# Patient Record
Sex: Female | Born: 1990 | Race: White | Hispanic: No | Marital: Single | Smoking: Never smoker
Health system: Southern US, Community
[De-identification: ages and names within clinical notes are randomized; demographics above are authoritative.]

## PROBLEM LIST (undated history)

## (undated) DIAGNOSIS — B3731 Acute candidiasis of vulva and vagina: Secondary | ICD-10-CM

## (undated) DIAGNOSIS — B373 Candidiasis of vulva and vagina: Secondary | ICD-10-CM

## (undated) DIAGNOSIS — N390 Urinary tract infection, site not specified: Secondary | ICD-10-CM

## (undated) HISTORY — DX: Acute candidiasis of vulva and vagina: B37.31

## (undated) HISTORY — DX: Urinary tract infection, site not specified: N39.0

## (undated) HISTORY — PX: FRACTURE SURGERY: SHX138

## (undated) HISTORY — DX: Candidiasis of vulva and vagina: B37.3

## (undated) HISTORY — PX: OTHER SURGICAL HISTORY: SHX169

---

## 2009-11-13 ENCOUNTER — Encounter: Payer: Self-pay | Admitting: Endocrinology

## 2009-11-13 LAB — CONVERTED CEMR LAB
ALT: 11 units/L
AST: 16 units/L
BUN: 9 mg/dL
Calcium: 9 mg/dL
Eosinophils Relative: 0.1 %
Glucose, Bld: 76 mg/dL
Hemoglobin: 13.3 g/dL
Monocytes Relative: 0.4 %
Total Bilirubin: 0.3 mg/dL
Total Protein: 6.6 g/dL
WBC: 5.6 10*3/uL

## 2009-11-16 ENCOUNTER — Encounter: Payer: Self-pay | Admitting: Endocrinology

## 2009-11-23 ENCOUNTER — Encounter: Payer: Self-pay | Admitting: Endocrinology

## 2009-11-28 ENCOUNTER — Encounter: Payer: Self-pay | Admitting: Endocrinology

## 2009-12-07 ENCOUNTER — Ambulatory Visit: Payer: Self-pay | Admitting: Endocrinology

## 2009-12-07 DIAGNOSIS — E041 Nontoxic single thyroid nodule: Secondary | ICD-10-CM

## 2010-04-30 NOTE — Op Note (Signed)
Summary: US guided Thyroid BX/[facility]  US guided Thyroid BX/[facility]   Imported By: Lester [place] 12/12/2009 08:07:27  _____________________________________________________________________  External Attachment:    Type:   Image     Comment:   External Document

## 2010-04-30 NOTE — Assessment & Plan Note (Signed)
Summary: NEW ENDO CONSULT/ THROID MASS/ [place] ACCESS/[facility]  #   Vital Signs:  Patient profile:   20 year old female Height:      62.5 inches (158.75 cm) Weight:      126.13 pounds (57.33 kg) BMI:     22.78 O2 Sat:      98 % on Room air Temp:     98.2 degrees F (36.78 degrees C) oral Pulse rate:   92 / minute BP sitting:   102 / 64  (left arm) Cuff size:   regular  Vitals Entered By: Brenton Grills MA (December 07, 2009 1:20 PM)  O2 Flow:  Room air CC: New Endo/Thyroid mass/aj   Referring Provider:  Jerlyn Ly MD Primary Provider:  Jerlyn Ly MD  CC:  New Endo/Thyroid mass/aj.  History of Present Illness: pt was noted on routine exam to have right lobe thyroid nodule, in 2010, and again in 2011.  pt says she does not notice the nodule.  no h/o xrt.  no neck pain. she does not have anxiety in general.  Current Medications (verified): 1)  Tri-Sprintec 0.18/0.215/0.25 Mg-35 Mcg Tabs (Norgestim-Eth Estrad Triphasic) .Marland Kitchen.. 1 Tablet By Mouth Once Daily 2)  Omega-3 350 Mg Caps (Omega-3 Fatty Acids) .Marland Kitchen.. 1 By Mouth Once Daily  Allergies (verified): No Known Drug Allergies  Past History:  Past Medical History: THYROID NODULE, RIGHT (ICD-241.0) FAMILY HISTORY OF ALCOHOLISM/ADDICTION (ICD-V61.41)  Family History: Reviewed history and no changes required. Family History of Alcoholism/Addiction (Grandparents) Family History of Arthritis (Grandparents) Family History of Prostate CA 1st degree relative <50  no thyroid dz  Social History: Reviewed history and no changes required. Single Never Smoked Alcohol use-no Drug use-no Regular exercise-yes full-time student Smoking Status:  never Drug Use:  no Does Patient Exercise:  yes Seat Belt Use:  yes  Review of Systems       denies difficulty swallowing or breathing.   denies weight loss, headache, hoarseness, double vision, palpitations, diarrhea, polyuria, myalgias, excessive diaphoresis, numbness, tremor,  bruising, and rhinorrhea.  she has monthly bleeding on the oral contraceptives  Physical Exam  General:  normal appearance.   Head:  head: no deformity eyes: no periorbital swelling, no proptosis external nose and ears are normal Neck:  2 cm right lobe thyroid nodule.  i cannot determine that the nodule is freely mobile.   Lungs:  Clear to auscultation bilaterally. Normal respiratory effort.  Heart:  Regular rate and rhythm without murmurs or gallops noted. Normal S1,S2.   Msk:  muscle bulk and strength are grossly normal.  no obvious joint swelling.  gait is normal and steady  Extremities:  no deformity no edema Skin:  not diaphoretic  Cervical Nodes:  No significant adenopathy.  Psych:  Alert and cooperative; normal mood and affect; normal attention span and concentration.   Additional Exam:  outside test results are reviewed:  tsh=normal ultrasound:  corresponds to palpable right thyroid nodule cytology:  suspicious for malignancy   Impression & Recommendations:  Problem # 1:  THYROID NODULE, RIGHT (ICD-241.0) bx is suspicious for malignancy 15 minute discussion, of high risk of malignancy, in 30 min appt  Problem # 2:  anxiety pt says related to dr visits.  Medications Added to Medication List This Visit: 1)  Tri-sprintec 0.18/0.215/0.25 Mg-35 Mcg Tabs (Norgestim-eth estrad triphasic) .Marland Kitchen.. 1 tablet by mouth once daily 2)  Omega-3 350 Mg Caps (Omega-3 fatty acids) .Marland Kitchen.. 1 by mouth once daily  Other Orders: Surgical Referral (Surgery)  New Patient Level IV (91478)  Patient Instructions: 1)  based on the result of the biopsy, you should see a surgery specialist, to consider removal of the suspicious lump.  you will be called with a day and time for an appointment.    Appended Document: NEW ENDO CONSULT/ THROID MASS/ [place] ACCESS/[facility]  # please cc to pcp dr Fredia Beets, thanks  Appended Document: NEW ENDO CONSULT/ THROID MASS/ [place] ACCESS/N[facility]  # faxed to [facility]/Dawn Binnie Kand at [phone]

## 2010-04-30 NOTE — Letter (Signed)
Summary: Publishing copy Family Medicine  [facility]   Imported By: Lester [place] 12/12/2009 07:59:58  _____________________________________________________________________  External Attachment:    Type:   Image     Comment:   External Document

## 2011-07-30 ENCOUNTER — Encounter: Payer: Self-pay | Admitting: Obstetrics and Gynecology

## 2011-07-30 ENCOUNTER — Ambulatory Visit (INDEPENDENT_AMBULATORY_CARE_PROVIDER_SITE_OTHER): Payer: BC Managed Care – PPO | Admitting: Obstetrics and Gynecology

## 2011-07-30 VITALS — BP 98/60 | Resp 16 | Ht 62.0 in | Wt 130.0 lb

## 2011-07-30 DIAGNOSIS — N898 Other specified noninflammatory disorders of vagina: Secondary | ICD-10-CM

## 2011-07-30 DIAGNOSIS — IMO0002 Reserved for concepts with insufficient information to code with codable children: Secondary | ICD-10-CM | POA: Insufficient documentation

## 2011-07-30 DIAGNOSIS — N760 Acute vaginitis: Secondary | ICD-10-CM

## 2011-07-30 DIAGNOSIS — N762 Acute vulvitis: Secondary | ICD-10-CM | POA: Insufficient documentation

## 2011-07-30 LAB — POCT WET PREP (WET MOUNT): pH: 4.5

## 2011-07-30 LAB — POCT URINALYSIS DIPSTICK
Bilirubin, UA: NEGATIVE
Blood, UA: NEGATIVE
Glucose, UA: NEGATIVE
Ketones, UA: NEGATIVE
Leukocytes, UA: NEGATIVE
pH, UA: 5

## 2011-07-30 MED ORDER — CLOBETASOL PROPIONATE 0.05 % EX CREA: TOPICAL_CREAM | Freq: Two times a day (BID) | CUTANEOUS | Status: DC

## 2011-07-30 NOTE — Progress Notes (Signed)
Odor: no Fever: no Pelvic Pain: no  Itching: yes Dyspareunia: yes Desires GC/CT: no  Thin: yes History of PID: no Desires HIV,RPR,HbsAG: no  Thick: yes History of STD: no Other: Burning w/ urination, and burning on the outside of her vagina area. Pt declines STD testing she stated she has been w/ her partner for 5 years now.   Subjective:    Pamela Carr is a 21 y.o. female, No obstetric history on file., who presents for a New patient problem exam. See above.  In November of 2012 the patient had a dental procedure and was given 10 days of penicillin.  After that she began having recurrent "yeast infections" that have not responded to Diflucan, and over-the-counter antifungal creams.  She now complains of vaginal and rectal burning and itching.  She complains of dyspareunia.  She has some dysuria.  She currently uses Ortho Tri-Cyclen for contraception.  She denies a past history of sexual transmitted infections.  Drug allergies: None  Past medical history: The patient had her wisdom teeth removed in November 2012.  She broke her arm in 1998.  Her current medications include birth control pills and probiotic.  Review of systems: Please see history of present illness.  Family history: The patient has a family history of asthma and rheumatoid arthritis.    History   Social History  . Marital Status: Single    Spouse Name: N/A    Number of Children: N/A  . Years of Education: N/A   Social History Main Topics  . Smoking status: Never Smoker   . Smokeless tobacco: None  . Alcohol Use: No  . Drug Use: No  . Sexually Active: None   Other Topics Concern  . None   Social History Narrative  . None    Menstrual cycle:   LMP: Patient's last menstrual period was 07/02/2011.           Cycle: Regular, monthly with normal flow and no severe dysmenorrha  The following portions of the patient's history were reviewed and updated as appropriate: allergies, current medications, past family  history, past medical history, past social history, past surgical history and problem list.  Review of Systems Pertinent items are noted in HPI. Breast:Negative for breast lump,nipple discharge or nipple retraction Gastrointestinal: Negative for abdominal pain, change in bowel habits or rectal bleeding Urinary:negative   Objective:    BP 98/60  Resp 16  Ht 5\' 2"  (1.575 m)  Wt 130 lb (58.968 kg)  BMI 23.78 kg/m2  LMP 07/02/2011    Weight:  Wt Readings from Last 1 Encounters:  07/30/11 130 lb (58.968 kg)          BMI: Body mass index is 23.78 kg/(m^2).  General Appearance: Alert, appropriate appearance for age. No acute distress HEENT: Grossly normal Neck / Thyroid: Supple, no masses, nodes or enlargement Lungs: clear to auscultation bilaterally Back: No CVA tenderness Breast Exam: deferred Cardiovascular: Regular rate and rhythm. S1, S2, no murmur Gastrointestinal: Soft, non-tender, no masses or organomegaly  ++++++++++++++++++++++++++++++++++++++++++++++++++++++++  Pelvic Exam: External genitalia: slightly red, tenderness at the introitus Vaginal: normal without tenderness, induration or masses Cervix: normal appearance Adnexa: normal bimanual exam Uterus: mid-position and nontender, normal size Rectovaginal: not indicated, no lesions noted at the anus  ++++++++++++++++++++++++++++++++++++++++++++++++++++++++  Lymphatic Exam: Non-palpable nodes in neck, clavicular, axillary, or inguinal regions Neurologic: Normal speech, no tremor  Psychiatric: Alert and oriented, appropriate affect.   Wet Prep:no pathogens, pH 4.5 and Whiff negative Urinalysis: negative UPT: Not done  Assessment:    vulvar irritation /vulvar and vaginal burning  dyspareunia  Overweight or obese: No   Pelvic relaxation: No   Plan:    We will try Temovate cream to the vulva for one month.  The patient will also apply olive oil to the area.  She will refrain from intercourse for 2 weeks to  give herself a chance to heal.  She will return to the office in 4 weeks for repeat examination. Contraception:oral contraceptives (estrogen/progesterone)    STD screen request: GC, chlamydia  RPR: No.  HBsAg: No. Hepatitis C: No.  Leonard Schwartz M.D.

## 2011-07-31 ENCOUNTER — Telehealth: Payer: Self-pay | Admitting: Obstetrics and Gynecology

## 2011-07-31 LAB — GC/CHLAMYDIA PROBE AMP, GENITAL: Chlamydia, DNA Probe: NEGATIVE

## 2011-07-31 NOTE — Telephone Encounter (Signed)
Spoke with pt rgd msg pt wants clarity on direction for vaginal cream prescribed by AVS pt states direction on box states use twice a day but AVs states every other day first week and twice a week after until f/u appt advised pt to use cream as directed by AVS pt voice understanding

## 2011-08-18 ENCOUNTER — Telehealth: Payer: Self-pay | Admitting: Obstetrics and Gynecology

## 2011-08-18 NOTE — Telephone Encounter (Signed)
Triage/epic/general ques

## 2011-08-18 NOTE — Telephone Encounter (Signed)
TC to pt. LM to return call regarding message. 

## 2011-08-18 NOTE — Telephone Encounter (Signed)
TC from pt.  States cream pescribed by Dr AVS has helped vag irritation.  States has noticed mold in bathtib area and is questioning if could contribute to cause.  Informed not able to confirm but can discuss w/Dr AVS at [place].  Pt agreeable.

## 2011-08-19 ENCOUNTER — Encounter: Payer: Self-pay | Admitting: Obstetrics and Gynecology

## 2011-08-19 ENCOUNTER — Ambulatory Visit (INDEPENDENT_AMBULATORY_CARE_PROVIDER_SITE_OTHER): Payer: BC Managed Care – PPO | Admitting: Obstetrics and Gynecology

## 2011-08-19 VITALS — BP 90/60 | Ht 62.0 in | Wt 128.0 lb

## 2011-08-19 DIAGNOSIS — N390 Urinary tract infection, site not specified: Secondary | ICD-10-CM

## 2011-08-19 DIAGNOSIS — N899 Noninflammatory disorder of vagina, unspecified: Secondary | ICD-10-CM

## 2011-08-19 DIAGNOSIS — N94819 Vulvodynia, unspecified: Secondary | ICD-10-CM

## 2011-08-19 DIAGNOSIS — N898 Other specified noninflammatory disorders of vagina: Secondary | ICD-10-CM

## 2011-08-19 LAB — POCT URINALYSIS DIPSTICK
Bilirubin, UA: NEGATIVE
Blood, UA: NEGATIVE
Glucose, UA: NEGATIVE
Ketones, UA: NEGATIVE
Nitrite, UA: NEGATIVE
pH, UA: 7

## 2011-08-19 NOTE — Progress Notes (Signed)
Ms. Nichol Ator is a 21 y.o. year old female,No obstetric history on file., who presents for a problem visit. She was treated with Temovate for vulvar discomfort and dyspareunia. She is slightly improved.   Subjective:  Wants to rule out STD. C/O urethral burning.  Objective:  BP 90/60  Ht 5\' 2"  (1.575 m)  Wt 128 lb (58.06 kg)  BMI 23.41 kg/m2  LMP 08/06/2011   Extremities: varicose veins noted  External genitalia: normal general appearance Vaginal: normal mucosa without prolapse or lesions Cervix: normal appearance Adnexa: normal bimanual exam Uterus: normal single, nontender  Assessment:  Dyspareunia Vulvar discomfort  Plan:  STD screen.  Estrogen cream to urethra.  Return to office prn if symptoms worsen or fail to improve.    Leonard Schwartz M.D.  08/19/2011 4:35 PM

## 2011-08-20 LAB — HIV ANTIBODY (ROUTINE TESTING W REFLEX): HIV: NONREACTIVE

## 2011-08-20 LAB — HEPATITIS C ANTIBODY: HCV Ab: NEGATIVE

## 2011-08-20 LAB — HSV 1 ANTIBODY, IGG: HSV 1 Glycoprotein G Ab, IgG: <0.1 IV

## 2011-08-20 LAB — GC/CHLAMYDIA PROBE AMP, GENITAL: GC Probe Amp, Genital: NEGATIVE

## 2011-08-20 LAB — RPR

## 2011-08-27 ENCOUNTER — Encounter: Payer: Self-pay | Admitting: Obstetrics and Gynecology

## 2011-08-27 ENCOUNTER — Ambulatory Visit (INDEPENDENT_AMBULATORY_CARE_PROVIDER_SITE_OTHER): Payer: BC Managed Care – PPO | Admitting: Obstetrics and Gynecology

## 2011-08-27 VITALS — BP 112/60 | Ht 62.5 in | Wt 129.0 lb

## 2011-08-27 DIAGNOSIS — N390 Urinary tract infection, site not specified: Secondary | ICD-10-CM

## 2011-08-27 DIAGNOSIS — F419 Anxiety disorder, unspecified: Secondary | ICD-10-CM | POA: Insufficient documentation

## 2011-08-27 DIAGNOSIS — F411 Generalized anxiety disorder: Secondary | ICD-10-CM

## 2011-08-27 DIAGNOSIS — IMO0002 Reserved for concepts with insufficient information to code with codable children: Secondary | ICD-10-CM

## 2011-08-27 LAB — POCT URINALYSIS DIPSTICK
Bilirubin, UA: NEGATIVE
Blood, UA: NEGATIVE
Glucose, UA: NEGATIVE
Nitrite, UA: NEGATIVE
Spec Grav, UA: 1.015
Urobilinogen, UA: NEGATIVE

## 2011-08-27 NOTE — Progress Notes (Signed)
[name] is a 21 y.o. year old female,No obstetric history on file., who presents for a problem visit. The patient has a four-month history of dyspareunia.  She has complaints of dysuria.  Her dysuria has improved with estrogen cream to the urethra.  Her urine studies are negative.  GC, Chlamydia, HIV, RPR, hepatitis C, HBsAg, herpes test are all negative.  Subjective:  Although she is better than with her last visit she still complains of dyspareunia particularly at the 7 o'clock position of the introitus.  Temovate cream helped temporarily but is not helpful now.  This is very frustrating for her.  The patient admits that she is anxious.  Objective:  BP 112/60  Ht 5' 2.5" (1.588 m)  Wt 129 lb (58.514 kg)  BMI 23.22 kg/m2  LMP 08/06/2011   General: anxious  External genitalia: normal general appearance and tenderness at the 7 o'clock position of the introitus.  No lesions seen. Urinary system: urethral meatus normal Vaginal: normal mucosa without prolapse or lesions Cervix: normal appearance Adnexa: normal bimanual exam Uterus: normal size and nontender. no obvious etiology for the patient's discomfort  Assessment:  Dyspareunia Improved dysuria Anxious  Plan:  The patient was encouraged to continue estrogen cream to the introitus. Discontinue Temovate. The patient wants to try trigger point injections of Marcaine and a steroid to see if this can help her discomfort improved.  Return to office in 2 week(s).   Leonard Schwartz M.D.  08/27/2011 7:28 PM

## 2011-09-11 ENCOUNTER — Ambulatory Visit (INDEPENDENT_AMBULATORY_CARE_PROVIDER_SITE_OTHER): Payer: BC Managed Care – PPO | Admitting: Obstetrics and Gynecology

## 2011-09-11 ENCOUNTER — Encounter: Payer: Self-pay | Admitting: Obstetrics and Gynecology

## 2011-09-11 VITALS — BP 120/64 | Ht 62.5 in | Wt 130.0 lb

## 2011-09-11 DIAGNOSIS — Z01419 Encounter for gynecological examination (general) (routine) without abnormal findings: Secondary | ICD-10-CM

## 2011-09-11 DIAGNOSIS — Z309 Encounter for contraceptive management, unspecified: Secondary | ICD-10-CM

## 2011-09-11 DIAGNOSIS — E041 Nontoxic single thyroid nodule: Secondary | ICD-10-CM

## 2011-09-11 DIAGNOSIS — Z124 Encounter for screening for malignant neoplasm of cervix: Secondary | ICD-10-CM

## 2011-09-11 LAB — CBC
HCT: 38.9 % (ref 36.0–46.0)
Hemoglobin: 12.5 g/dL (ref 12.0–15.0)
MCHC: 32.1 g/dL (ref 30.0–36.0)
RBC: 4.54 MIL/uL (ref 3.87–5.11)
WBC: 6.6 10*3/uL (ref 4.0–10.5)

## 2011-09-11 MED ORDER — NORGESTIMATE-ETH ESTRADIOL 0.25-35 MG-MCG PO TABS: 1.0000 | ORAL_TABLET | Freq: Every day | ORAL | Status: DC

## 2011-09-11 NOTE — Progress Notes (Signed)
Last Pap: 08/2010 WNL: Yes Regular Periods:yes Contraception: BC Pills  Monthly Breast exam:no Tetanus<34yrs:yes Nl.Bladder Function:yes Daily BMs:yes Healthy Diet:yes Calcium:no Mammogram:no Exercise:yes Seatbelt: yes Abuse at home: no Stressful work:no Sigmoid-colonoscopy: N/A Bone Density: No  Pt here to f/u for vaginal irritation from yeast infection starting after taking abx. States that she is not having the irritation as much. Only occasionally after intercourse. Requested to have AEX done today while she was here.   ANNUAL GYNECOLOGIC EXAMINATION   Pamela Carr is a 21 y.o. female, G0P0000, who presents for an annual exam. See above. Her dyspareunia has improved dramatically and her bulbar discomfort has also improved dramatically.  She reports that she has a history of a thyroid nodule that has been evaluated and found to be benign.  She tends to be anxious.  Prior Hysterectomy: No    History   Social History  . Marital Status: Single    Spouse Name: N/A    Number of Children: N/A  . Years of Education: N/A   Social History Main Topics  . Smoking status: Never Smoker   . Smokeless tobacco: Never Used  . Alcohol Use: No  . Drug Use: No  . Sexually Active: Yes    Birth Control/ Protection: Pill   Other Topics Concern  . None   Social History Narrative  . None    Menstrual cycle:   LMP: Patient's last menstrual period was 09/03/2011.           Cycle: Regular, monthly with normal flow and no severe dysmenorrha  The following portions of the patient's history were reviewed and updated as appropriate: allergies, current medications, past family history, past medical history, past social history, past surgical history and problem list.  Review of Systems Pertinent items are noted in HPI. Breast:Negative for breast lump,nipple discharge or nipple retraction Gastrointestinal: Negative for abdominal pain, change in bowel habits or rectal  bleeding Urinary:negative   Objective:    BP 120/64  Ht 5' 2.5" (1.588 m)  Wt 130 lb (58.968 kg)  BMI 23.40 kg/m2  LMP 09/03/2011    Weight:  Wt Readings from Last 1 Encounters:  09/11/11 130 lb (58.968 kg)          BMI: Body mass index is 23.40 kg/(m^2).  General Appearance: Alert, appropriate appearance for age. No acute distress HEENT: Grossly normal Neck / Thyroid: Supple, nontender, fullness noted on the right lower lobe Lungs: clear to auscultation bilaterally Back: No CVA tenderness Breast Exam: No masses or nodes.No dimpling, nipple retraction or discharge. Cardiovascular: Regular rate and rhythm. S1, S2, no murmur Gastrointestinal: Soft, non-tender, no masses or organomegaly  ++++++++++++++++++++++++++++++++++++++++++++++++++++++++  Pelvic Exam: External genitalia: normal general appearance Vaginal: normal without tenderness, induration or masses and relaxation: No Cervix: normal appearance Adnexa: normal bimanual exam Uterus: normal size, shape, and consistency Rectovaginal: not indicated  ++++++++++++++++++++++++++++++++++++++++++++++++++++++++  Lymphatic Exam: Non-palpable nodes in neck, clavicular, axillary, or inguinal regions Neurologic: Normal speech, no tremor  Psychiatric: Alert and oriented, appropriate affect.   Wet Prep:   not applicable Urinalysis:  not applicable UPT:           Not done   Assessment:    Normal gyn exam   Overweight or obese: No   Pelvic relaxation: No  Improved dyspareunia and vulvar discomfort.  Contraceptive management  Benign thyroid cyst   Plan:    pap smear return annually or prn Contraception:Ortho-Cyclen    Medications prescribed: Ortho-Cyclen  STD screen request: none  RPR: No.  HBsAg: No.  Hepatitis C: No.  The updated Pap smear screening guidelines were discussed with the patient. The patient requested that I obtain a Pap smear: Yes.  Kegel exercises discussed: No.  Proper diet and regular  exercise were reviewed.  Annual mammograms recommended starting at age 36. Proper breast care was discussed.  Screening colonoscopy is recommended beginning at age 33.  Regular health maintenance was reviewed.  Sleep hygiene was discussed.  Adequate calcium and vitamin D intake was emphasized.  TSH, CBC, BMP  Leonard Schwartz M.D.

## 2011-09-12 LAB — BASIC METABOLIC PANEL
CO2: 28 mEq/L (ref 19–32)
Calcium: 8.9 mg/dL (ref 8.4–10.5)
Creat: 0.74 mg/dL (ref 0.50–1.10)
Sodium: 141 mEq/L (ref 135–145)

## 2011-09-12 LAB — TSH: TSH: 2.908 u[IU]/mL (ref 0.350–4.500)

## 2011-09-16 LAB — PAP IG W/ RFLX HPV ASCU

## 2011-09-17 LAB — HUMAN PAPILLOMAVIRUS, HIGH RISK: HPV DNA High Risk: NOT DETECTED

## 2011-09-18 ENCOUNTER — Encounter: Payer: Self-pay | Admitting: Obstetrics and Gynecology

## 2011-09-18 NOTE — Progress Notes (Signed)
Quick Note:  Send atypical Pap smear letter with a note to return for repeat Pap in six months. ______ 

## 2011-09-22 ENCOUNTER — Telehealth: Payer: Self-pay | Admitting: Obstetrics and Gynecology

## 2011-09-22 NOTE — Telephone Encounter (Signed)
Triage/epic/AVS pt

## 2011-09-22 NOTE — Telephone Encounter (Signed)
Pt states she has burning with urination and would like to be seen by Dr Stefano Gaul today. Advised pt that Dr Stefano Gaul does not have any openings this week but that if she would be willing to see another provider that EP had openings for tomorrow. Pt states that she only feels comfortable seeing Dr Stefano Gaul and that she will call back to see if there are any cancellations.

## 2011-10-06 ENCOUNTER — Telehealth: Payer: Self-pay | Admitting: Obstetrics and Gynecology

## 2011-10-06 NOTE — Telephone Encounter (Signed)
TRIAGE/RX °

## 2011-10-07 ENCOUNTER — Telehealth: Payer: Self-pay

## 2011-10-07 NOTE — Telephone Encounter (Signed)
Message to to Dr. Stefano Gaul Regarding pt Rx and Results. Will await Dr.Stringer's response/ ask him tomorrow when he is in the office.  LC

## 2011-10-07 NOTE — Telephone Encounter (Signed)
avs pt 

## 2011-10-14 ENCOUNTER — Telehealth: Payer: Self-pay | Admitting: Obstetrics and Gynecology

## 2011-10-14 NOTE — Telephone Encounter (Signed)
AVS PT 

## 2011-10-15 ENCOUNTER — Telehealth: Payer: Self-pay

## 2011-10-15 NOTE — Telephone Encounter (Signed)
TC to pt the morning to confirm request for a different BC Rx. Pt states the Rx she had was Sprintec and the Rx she needs is TriSprintec. Pt also stated that when she called [facility] the voice machine told her there were no labs ava.  Dr.Stringer was made aware this morning and confirmed that the pharm may be sub. Her BC pills Task was taken by PG to call correct Rx and make pt aware of Lab results. Per PG the birth control that was originally called in is the  generic form. PG will make pt aware.  [facility] CMA

## 2011-10-15 NOTE — Telephone Encounter (Signed)
Tc to pt regarding msg, pt states she did get her bc issue straightened out, wanted reassurance that blood test results were ok, informed was all wnl, pt did receive letter of abnl pap, is on recall list to repeat pap in 6 months, pt voices understanding.

## 2011-10-21 ENCOUNTER — Telehealth: Payer: Self-pay | Admitting: Obstetrics and Gynecology

## 2011-10-21 NOTE — Telephone Encounter (Signed)
Pt is c/o: Burning w/ urination and off and on yeast infections. Pt stated [place] the [place]  and believes from wearing wet bathing suits caused yeast; pt is unsure. Pt was made aware AVS is not in the office today and has no ava. appts  10/22/11 or 10/23/11. Pt was encouraged if issues were urgent to see her PCP or Urgent Care. Pt stated issues are not urgent and she is not in any pain. Pt wants to only see AVS appt confirmed for Tuesday  10/28/2011 @ 11:30am  LC CMA

## 2011-10-28 ENCOUNTER — Encounter: Payer: Self-pay | Admitting: Obstetrics and Gynecology

## 2011-10-28 ENCOUNTER — Ambulatory Visit (INDEPENDENT_AMBULATORY_CARE_PROVIDER_SITE_OTHER): Payer: BC Managed Care – PPO | Admitting: Obstetrics and Gynecology

## 2011-10-28 VITALS — BP 96/70 | Temp 98.0°F | Resp 16 | Ht 62.0 in | Wt 125.0 lb

## 2011-10-28 DIAGNOSIS — R300 Dysuria: Secondary | ICD-10-CM

## 2011-10-28 DIAGNOSIS — N898 Other specified noninflammatory disorders of vagina: Secondary | ICD-10-CM

## 2011-10-28 DIAGNOSIS — N9089 Other specified noninflammatory disorders of vulva and perineum: Secondary | ICD-10-CM

## 2011-10-28 DIAGNOSIS — N390 Urinary tract infection, site not specified: Secondary | ICD-10-CM

## 2011-10-28 LAB — POCT URINALYSIS DIPSTICK
Bilirubin, UA: NEGATIVE
Glucose, UA: NEGATIVE
Ketones, UA: NEGATIVE
Leukocytes, UA: NEGATIVE
Nitrite, UA: NEGATIVE

## 2011-10-28 LAB — POCT WET PREP (WET MOUNT)
Bacteria Wet Prep HPF POC: NEGATIVE
pH: 4.5

## 2011-10-28 NOTE — Progress Notes (Signed)
HISTORY OF PRESENT ILLNESS  [name] is a 21 y.o. year old female,G0P0000, who presents for a problem visit. The patient has a history of dyspareunia that is improved.  She has a history of vaginitis that is improved.  Subjective:  The patient complains of dysuria and vulvar irritation from last week.  This is slightly better this week.  Objective:  BP 96/70  Temp 98 F (36.7 C) (Oral)  Resp 16  Ht 5\' 2"  (1.575 m)  Wt 125 lb (56.7 kg)  BMI 22.86 kg/m2  LMP 09/30/2011   General: alert and no distress GI: nontender  External genitalia: normal general appearance Vaginal: normal without tenderness, induration or masses Cervix: normal appearance Adnexa: normal bimanual exam Uterus: normal  Urinalysis: Negative  Wet prep: Negative  Assessment:  Dysuria Vulvar irritation of uncertain etiology  Plan:  Use the estrogen cream and steroid cream she has at home as needed.  Return to office prn if symptoms worsen or fail to improve.   Leonard Schwartz M.D.  10/28/2011 12:40 PM

## 2011-11-12 ENCOUNTER — Telehealth: Payer: Self-pay | Admitting: Obstetrics and Gynecology

## 2011-11-12 NOTE — Telephone Encounter (Signed)
Spoke with pt rgd msg pt states used vag cream now burning is worse wants to know what to do advised pt can monitor to see if it  get better or worse or schd appt for eval pt states she will wait and see if burning gets better will call tomorrow with update

## 2011-11-14 ENCOUNTER — Telehealth: Payer: Self-pay | Admitting: Obstetrics and Gynecology

## 2011-11-14 NOTE — Telephone Encounter (Signed)
TRIAGE/APPT. °

## 2011-11-14 NOTE — Telephone Encounter (Signed)
Tc to pt per telephone call. Pt states,"has an appt with PCP today for eval of ? yeast infection. Pt agrees.

## 2011-11-17 ENCOUNTER — Telehealth: Payer: Self-pay | Admitting: Obstetrics and Gynecology

## 2011-11-17 NOTE — Telephone Encounter (Signed)
Pt called was treated on Friday by her PCP for a yeast infection with 3 days of diflucan pt wants to make sure infection gone advised that was ok  She has made an appt to see AVS Thursday DFaulconerRN

## 2011-11-20 ENCOUNTER — Ambulatory Visit (INDEPENDENT_AMBULATORY_CARE_PROVIDER_SITE_OTHER): Payer: BC Managed Care – PPO | Admitting: Obstetrics and Gynecology

## 2011-11-20 ENCOUNTER — Encounter: Payer: Self-pay | Admitting: Obstetrics and Gynecology

## 2011-11-20 VITALS — BP 92/60 | Temp 97.9°F | Wt 125.0 lb

## 2011-11-20 DIAGNOSIS — N899 Noninflammatory disorder of vagina, unspecified: Secondary | ICD-10-CM

## 2011-11-20 DIAGNOSIS — N898 Other specified noninflammatory disorders of vagina: Secondary | ICD-10-CM

## 2011-11-20 DIAGNOSIS — R350 Frequency of micturition: Secondary | ICD-10-CM

## 2011-11-20 LAB — POCT URINALYSIS DIPSTICK
Bilirubin, UA: NEGATIVE
Ketones, UA: NEGATIVE
Leukocytes, UA: NEGATIVE
Nitrite, UA: NEGATIVE
Protein, UA: NEGATIVE
pH, UA: 6

## 2011-11-20 LAB — POCT WET PREP (WET MOUNT): pH: 4.5

## 2011-11-20 MED ORDER — AMBULATORY NON FORMULARY MEDICATION: Status: DC

## 2011-11-20 NOTE — Progress Notes (Signed)
Color: clear/white Odor: no Itching:yes Thin:yes Thick:no Fever:no Dyspareunia:yes Hx PID:no HX STD:no Pelvic Pain:yes Desires Gc/CT:no Desires HIV,RPR,HbsAG:no Pt c/o urinary burning and frequency.   HISTORY OF PRESENT ILLNESS  [name] is a 21 y.o. year old female,G0P0000, who presents for a problem visit. The patient has had difficulty with vaginal discomfort.  She reports that she was treated with Diflucan for 3 days by her family physician.  She is still having some vaginal irritation.  She has dyspareunia.  Subjective:  The patient complains of a change in her bowel movements.  She denies bleeding.  She says that her bowel movements are soft but not watery.  She denies nausea and vomiting.  Objective:  BP 92/60  Temp 97.9 F (36.6 C)  Wt 125 lb (56.7 kg)  LMP 10/29/2011   General: alert Resp: clear to auscultation bilaterally Cardio: regular rate and rhythm, S1, S2 normal, no murmur, click, rub or gallop GI: soft and nontender  External genitalia: normal general appearance Vaginal: normal without tenderness, induration or masses Cervix: normal appearance Adnexa: normal bimanual exam Uterus: normal size shape and consistency  Urine analysis: Normal  Wet prep: Yeast  Assessment:  Yeast vaginitis-recurrent  Change in bowel habits  Dyspareunia  History of anxiety  Plan:  Boric Acid suppositories 600 mg twice each week  Diet and bowel function discussed.  Return to office prn if symptoms worsen or fail to improve.   Leonard Schwartz M.D.  11/20/2011 1:51 PM

## 2011-11-24 ENCOUNTER — Telehealth: Payer: Self-pay | Admitting: Obstetrics and Gynecology

## 2011-11-24 NOTE — Telephone Encounter (Signed)
TC to pt. States has started using boric acid and felt improvement after first dose. States had possible UTI when seen at PCP 1 week before visit with DR AVS.  Questioning urine result. Informed normal. States has thyroid cysts with normal labs. Questioning if could cause recurrent yeast infections.  Will consult PCP and endocrinologist to F/U testing.  States having some burning and pelvic discomfort today but had IC last PM, menses is due and to use boric acid tonight.  Will call if sx do not improve to R/O UTI.  Discussed continued use of boric acid if sx resolve but to call if no long term improvement. Questions answered. Pt appreciative. Phone call lasted 12 minutes.

## 2011-12-15 ENCOUNTER — Telehealth: Payer: Self-pay | Admitting: Obstetrics and Gynecology

## 2011-12-15 NOTE — Telephone Encounter (Signed)
Spoke with Carr Pamela msg Carr c/o recurrent yeast infection not getting better and anal itching wants eval  With avs Carr has appt 12/18/11 at 3:30 Carr voice understanding

## 2011-12-18 ENCOUNTER — Encounter: Payer: Self-pay | Admitting: Obstetrics and Gynecology

## 2011-12-18 ENCOUNTER — Ambulatory Visit (INDEPENDENT_AMBULATORY_CARE_PROVIDER_SITE_OTHER): Payer: BC Managed Care – PPO | Admitting: Obstetrics and Gynecology

## 2011-12-18 VITALS — BP 104/64 | Ht 62.0 in | Wt 126.0 lb

## 2011-12-18 DIAGNOSIS — K6289 Other specified diseases of anus and rectum: Secondary | ICD-10-CM

## 2011-12-18 DIAGNOSIS — N898 Other specified noninflammatory disorders of vagina: Secondary | ICD-10-CM

## 2011-12-18 DIAGNOSIS — IMO0002 Reserved for concepts with insufficient information to code with codable children: Secondary | ICD-10-CM

## 2011-12-18 DIAGNOSIS — R300 Dysuria: Secondary | ICD-10-CM

## 2011-12-18 LAB — POCT WET PREP (WET MOUNT)
Clue Cells Wet Prep Whiff POC: NEGATIVE
Trichomonas Wet Prep HPF POC: NEGATIVE
pH: 4.5

## 2011-12-18 LAB — POCT URINALYSIS DIPSTICK
Blood, UA: NEGATIVE
Nitrite, UA: NEGATIVE
Spec Grav, UA: <=1.005
Urobilinogen, UA: NEGATIVE
pH, UA: 7

## 2011-12-18 NOTE — Progress Notes (Signed)
HISTORY OF PRESENT ILLNESS  [name] is a 21 y.o. year old female,[id number], who presents for a problem visit. The patient has a history of vulvar irritation, and dyspareunia.  She was recently treated for yeast infection.  Subjective:  The patient reports that her vaginal itching and her vulvar irritation are much better after treatment with Diflucan.  She continues to complain of dyspareunia at the introitus in 1 spot only.  She also complains of rectal irritation.  She denies rectal bleeding.  Objective:  BP 104/64  Ht 5\' 2"  (1.575 m)  Wt 126 lb (57.153 kg)  BMI 23.05 kg/m2  LMP 11/26/2011   General: alert and no distress GI: nontender, no masses  External genitalia: normal general appearance and tenderness at the 10 o'clock position at the introitus.  No lesions or pathology is appreciated Vaginal: normal without tenderness, induration or masses Cervix: normal appearance Adnexa: normal bimanual exam Uterus: normal, nontender Rectal: good sphincter tone, no masses and no lesions are seen.  Wet prep: Negative  Assessment:  Continued dyspareunia Rectal irritation of uncertain etiology Improved vaginal yeast  Plan:  We discussed several different treatment possibilities.  She will continue to explore ways to make intercourse more comfortable.  Return to office prn if symptoms worsen or fail to improve.  Leonard Schwartz M.D.  12/18/2011 8:49 PM

## 2011-12-19 ENCOUNTER — Telehealth: Payer: Self-pay | Admitting: Obstetrics and Gynecology

## 2011-12-19 NOTE — Telephone Encounter (Signed)
JACKIE/RETURN CALL

## 2011-12-23 ENCOUNTER — Telehealth: Payer: Self-pay

## 2011-12-23 NOTE — Telephone Encounter (Signed)
LM for pt to either call me or LaToya back about ? She had about Boric acid. JO, CMA

## 2011-12-23 NOTE — Telephone Encounter (Signed)
Spoke to pt who has questions about exactly how long Dr. Carl Best her to stay on the Boric acid. She also wonders if there are any adverse side effects of it. I told her I would put those ?'s to him and get back with her. [name] A

## 2011-12-25 ENCOUNTER — Telehealth: Payer: Self-pay

## 2011-12-25 NOTE — Telephone Encounter (Signed)
Spoke to pt to let her know Dr  Stefano Gaul said to cont. The Boric acid supps through the end of Oct. 2013. Then she can stop and most likely she will be able to maintain a normal vaginal PH without it. Pt understands and will do this. [name] A

## 2011-12-31 ENCOUNTER — Encounter: Payer: BC Managed Care – PPO | Admitting: Obstetrics and Gynecology

## 2012-01-12 ENCOUNTER — Telehealth: Payer: Self-pay | Admitting: Obstetrics and Gynecology

## 2012-01-12 ENCOUNTER — Ambulatory Visit (INDEPENDENT_AMBULATORY_CARE_PROVIDER_SITE_OTHER): Payer: BC Managed Care – PPO | Admitting: Family Medicine

## 2012-01-12 VITALS — BP 96/68 | HR 97 | Temp 97.9°F | Resp 16 | Ht 62.5 in | Wt 127.0 lb

## 2012-01-12 DIAGNOSIS — N760 Acute vaginitis: Secondary | ICD-10-CM

## 2012-01-12 DIAGNOSIS — Z23 Encounter for immunization: Secondary | ICD-10-CM

## 2012-01-12 DIAGNOSIS — R102 Pelvic and perineal pain: Secondary | ICD-10-CM

## 2012-01-12 DIAGNOSIS — B9689 Other specified bacterial agents as the cause of diseases classified elsewhere: Secondary | ICD-10-CM

## 2012-01-12 DIAGNOSIS — R300 Dysuria: Secondary | ICD-10-CM

## 2012-01-12 DIAGNOSIS — N949 Unspecified condition associated with female genital organs and menstrual cycle: Secondary | ICD-10-CM

## 2012-01-12 LAB — POCT UA - MICROSCOPIC ONLY
Bacteria, U Microscopic: NEGATIVE
Casts, Ur, LPF, POC: NEGATIVE
Crystals, Ur, HPF, POC: NEGATIVE
Mucus, UA: NEGATIVE
RBC, urine, microscopic: NEGATIVE
WBC, Ur, HPF, POC: NEGATIVE
Yeast, UA: NEGATIVE

## 2012-01-12 LAB — POCT URINALYSIS DIPSTICK
Bilirubin, UA: NEGATIVE
Blood, UA: NEGATIVE
Glucose, UA: NEGATIVE
Ketones, UA: NEGATIVE
Leukocytes, UA: NEGATIVE
Nitrite, UA: NEGATIVE
Protein, UA: NEGATIVE
Spec Grav, UA: 1.015
Urobilinogen, UA: 0.2
pH, UA: 6

## 2012-01-12 LAB — POCT WET PREP WITH KOH
KOH Prep POC: NEGATIVE
Trichomonas, UA: NEGATIVE
Yeast Wet Prep HPF POC: NEGATIVE

## 2012-01-12 MED ORDER — METRONIDAZOLE 500 MG PO TABS: 500.0000 mg | ORAL_TABLET | Freq: Two times a day (BID) | ORAL | Status: DC

## 2012-01-12 NOTE — Progress Notes (Signed)
Subjective:    Patient ID: Pamela Carr, female    DOB: [birth year], 21 y.o.   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 Has been followed by Dr. Stefano Gaul at [facility].  Recently has been using 2x/wk 600mg  boric acid vag supp with good result and taking an oral vaginal support probiotic but stopped boric acid last wk - to see if she still needed it - and now has again developed some external genital burning during urination. Did use a another boric acid supp 2d prev which did seem to help some. Is also having some left adnexal pain radiating posteriorly to her back through this past wk and she has been grasping her back.  Did notice that she was likely ovulating this past wk. Not constipated but decreased freq of BM for last few d. Did have some upset stomach w/ increased bowel freq on Sat - 2d prev.  Started after she was stressed as her boyfriend is having a neurologic w/u (but thinks it is all just triggered by his anxiety). She has pain on her inner right lower labia minora during sex and there has been a spot of blood when wiping for the past wk - very faint pink.  She has had this examined many times over the past yr and nothing has been found as a source of her pain but she was given clobestasol and estradiol to try which she hasn't used consistently as different. Has a very itchy bump on left upper outer buttock - concerned it is a spider bite.  Review of Systems  Constitutional: Negative for fever, chills, diaphoresis, activity change, appetite change, fatigue and unexpected weight change.  Gastrointestinal: Negative for nausea, vomiting, abdominal pain, diarrhea, constipation, blood in stool, abdominal distention, anal bleeding and rectal pain.  Genitourinary: Positive for dysuria, vaginal discharge, genital sores, vaginal pain, pelvic pain and dyspareunia. Negative for  urgency, frequency, hematuria, flank pain, decreased urine volume, vaginal bleeding, enuresis, difficulty urinating and menstrual problem.  Musculoskeletal: Negative for myalgias, joint swelling, arthralgias and gait problem.  Skin: Negative for color change and pallor.  Hematological: Negative for adenopathy.       Past Medical History  Diagnosis Date  . Vaginal yeast infection   . UTI (urinary tract infection)    BP 96/68  Pulse 97  Temp 97.9 F (36.6 C) (Oral)  Resp 16  Ht 5' 2.5" (1.588 m)  Wt 127 lb (57.607 kg)  BMI 22.86 kg/m2  SpO2 100%  LMP 12/24/2011  Objective:   Physical Exam  Constitutional: She is oriented to person, place, and time. She appears well-developed and well-nourished. No distress.  HENT:  Head: Normocephalic and atraumatic.  Eyes: Conjunctivae normal are normal. No scleral icterus.  Pulmonary/Chest: Effort normal.  Abdominal: Soft. Bowel sounds are normal. She exhibits no distension and no mass. There is no tenderness. There is no rebound and no guarding.  Genitourinary: Uterus normal.    There is tenderness on the right labia. There is no rash on the right labia. No erythema, tenderness or bleeding around the vagina. Vaginal discharge found.       Subtle area of mucosal thinning on inner right labia minora at area of pain  Musculoskeletal: She exhibits no edema.  Neurological: She is alert and oriented to person, place, and time.  Skin: Skin is warm and dry. Lesion noted. She is not  diaphoretic.          Superficial papule 1 cm dm on upper outer left buttock  Psychiatric: She has a normal mood and affect. Her behavior is normal.     Results for orders placed in visit on 01/12/12  POCT UA - MICROSCOPIC ONLY      Component Value Range   WBC, Ur, HPF, POC neg     RBC, urine, microscopic neg     Bacteria, U Microscopic neg     Mucus, UA neg     Epithelial cells, urine per micros 0-3     Crystals, Ur, HPF, POC neg     Casts, Ur, LPF, POC neg      Yeast, UA neg    POCT URINALYSIS DIPSTICK      Component Value Range   Color, UA yellow     Clarity, UA clear     Glucose, UA neg     Bilirubin, UA neg     Ketones, UA neg     Spec Grav, UA 1.015     Blood, UA neg     pH, UA 6.0     Protein, UA neg     Urobilinogen, UA 0.2     Nitrite, UA neg     Leukocytes, UA Negative    POCT WET PREP WITH KOH      Component Value Range   Trichomonas, UA Negative     Clue Cells Wet Prep HPF POC 50%     Epithelial Wet Prep HPF POC 3-8     Yeast Wet Prep HPF POC neg     Bacteria Wet Prep HPF POC 4+     RBC Wet Prep HPF POC neg     WBC Wet Prep HPF POC 0-5     KOH Prep POC Negative    GC/CHLAMYDIA PROBE AMP, URINE      Component Value Range   Chlamydia, Swab/Urine, PCR NEGATIVE  NEGATIVE   GC Probe Amp, Urine NEGATIVE  NEGATIVE     Assessment & Plan:  1. BV - handout given.  trx w/ 1 wk of flagyl 500 bid and boric acid qhs x 1 wk then resume 2x/wk boric acid to maintain vag ph. 2. Inner right labia minora irritation - try spot trx w/ top estradiol for 2 wks 3. L adnexal pain - perhaps due to ovulation? Cont to monitor and avoid constipation 4. Insect bite - try top hydrocortisone or anti-itch, cont to monitor and RTC if increasing pain, warmth, size

## 2012-01-12 NOTE — Telephone Encounter (Signed)
Spoke with pt rgd msg pt c/o burning and  Itching in vaginal area wants appt with [facility] informed pt first avail 01/13/12 pt declined appt will go to urgent care

## 2012-01-12 NOTE — Patient Instructions (Signed)
Results can be improved by adding vaginal boric acid to the oral nitroimidazole induction therapy [122]. Metronidazole or tinidazole is taken orally for seven days; vaginal boric acid 600 mg once daily at bedtime is begun at the same time and continued for 21 days. Patients are seen for follow-up a day or two after their last boric acid dose; if they are in remission, we immediately begin metronidazole gel twice weekly for four to six months as suppressive therapy.

## 2012-01-13 ENCOUNTER — Encounter: Payer: Self-pay | Admitting: Family Medicine

## 2012-01-13 LAB — GC/CHLAMYDIA PROBE AMP, URINE: Chlamydia, Swab/Urine, PCR: NEGATIVE

## 2012-01-16 ENCOUNTER — Ambulatory Visit: Payer: BC Managed Care – PPO

## 2012-01-16 ENCOUNTER — Ambulatory Visit (INDEPENDENT_AMBULATORY_CARE_PROVIDER_SITE_OTHER): Payer: BC Managed Care – PPO | Admitting: Family Medicine

## 2012-01-16 VITALS — BP 106/60 | HR 68 | Temp 97.6°F | Resp 16 | Ht 62.0 in | Wt 125.4 lb

## 2012-01-16 DIAGNOSIS — R1032 Left lower quadrant pain: Secondary | ICD-10-CM

## 2012-01-16 MED ORDER — POLYETHYLENE GLYCOL 3350 17 GM/SCOOP PO POWD: 17.0000 g | Freq: Every day | ORAL | Status: DC

## 2012-01-16 NOTE — Progress Notes (Signed)
Subjective:    Patient ID: Pamela Carr, female    DOB: [birth year], 21 y.o.   MRN: [id number]  HPI  Pamela Carr is a 21 yr old female seen here four days ago and treated for BV.  She has a well documented history of recurrent yeast infections, pelvic pain, and urinary symptoms.  Her vaginal symptoms are much improved today with treatment of BV, and she was able to have pain free intercourse yesterday.  She is still experiencing LLQ abdominal pain though.  She states that this pain has been present for probably 1.5 years but that it comes and goes.  She feels like it may be related to her period and wonders if she may have an ovarian cyst, though she is using OCPs and has been for 5 years.  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  She describes the pain as dull and  "fullness" and "slightly burny".  She wonders if there may be a muscular component as she does Insanity workouts 3 times per week.  She denies constipation stating she generally has one bowel movement per day.  She denies hard stools or straining.  She shared that she does have anal sex.  She denies rectal pain or blood in the stool.  She denies nausea, vomiting, diarrhea, bloating, distension, or early satiety.  No fever or chills.  No urinary symptoms.  UA and urine micro done 01/12/12 were negative.  GC/Chlamydia 01/12/12 also negative.     Review of Systems  Constitutional: Negative for fever and chills.  Respiratory: Negative for cough and shortness of breath.   Cardiovascular: Negative.   Gastrointestinal: Positive for abdominal pain (LLQ). Negative for nausea, vomiting, diarrhea, constipation, blood in stool, anal bleeding and rectal pain.  Genitourinary: Negative for urgency, frequency, hematuria, vaginal discharge and difficulty urinating.  Musculoskeletal: Negative.   Skin: Negative.   Neurological: Negative for dizziness, syncope,  light-headedness and headaches.       Objective:   Physical Exam  Constitutional: She is oriented to person, place, and time. She appears well-developed and well-nourished. No distress.  Cardiovascular: Normal rate, regular rhythm, normal heart sounds and intact distal pulses.  Exam reveals no gallop and no friction rub.   No murmur heard. Pulmonary/Chest: Breath sounds normal. She has no wheezes. She has no rales.  Abdominal: Soft. Bowel sounds are normal. She exhibits no distension and no mass. There is no tenderness. There is no rebound and no guarding.  Genitourinary:       Deferred as her pelvic exam was normal on 01/12/12  Neurological: She is alert and oriented to person, place, and time.  Skin: Skin is warm and dry.  Psychiatric: She has a normal mood and affect. Her behavior is normal.   Filed Vitals:   01/16/12 1411  BP: 106/60  Pulse: 68  Temp: 97.6 F (36.4 C)  Resp: 16   [facility] reading (PRIMARY) by  Dr. Nilda Simmer - large amount of stool in the colon, otherwise normal.         Assessment & Plan:   1. LLQ pain  DG Abd 1 View, polyethylene glycol powder (GLYCOLAX/MIRALAX) powder   Pamela Carr is a 21 yr old female with a long history of intermittent LLQ pain.  Her physical exam is normal.  Pelvic exam deferred as this was done by Dr. Clelia Croft earlier this week and was  normal.  Urine, GC/chlamydia all negative this week.  Her pain seems to be more abdominal in location rather than pelvic.  KUB read with Dr. Katrinka Blazing revealed stool in the large bowel but was otherwise normal.  Discussed with patient that her physical exam is reassuring.  Discussed the possibility that constipation could be at least partly responsible for her LLQ discomfort.  Recommended 3-4 weeks of Miralax with close follow-up.  Discussed with patient that if Miralax does not improve her symptoms we can proceed with pelvic ultrasound and possibly CT of the abdomen.  She is amenable to this plan and will  follow-up either with me or with Dr. Clelia Croft in 3-4 weeks.

## 2012-01-19 NOTE — Progress Notes (Signed)
Below history, physical examination, recent lab results, and KUB reviewed in detail with Frances Furbish, PA-C.  KUB with moderate amount of stool in colon.  Agree with assessment and plan as outlined below.  KMS

## 2012-02-06 ENCOUNTER — Ambulatory Visit (INDEPENDENT_AMBULATORY_CARE_PROVIDER_SITE_OTHER): Payer: BC Managed Care – PPO | Admitting: Family Medicine

## 2012-02-06 VITALS — BP 102/66 | HR 81 | Temp 97.9°F | Resp 16 | Ht 63.0 in | Wt 123.0 lb

## 2012-02-06 DIAGNOSIS — N898 Other specified noninflammatory disorders of vagina: Secondary | ICD-10-CM

## 2012-02-06 DIAGNOSIS — N899 Noninflammatory disorder of vagina, unspecified: Secondary | ICD-10-CM

## 2012-02-06 LAB — POCT URINALYSIS DIPSTICK
Glucose, UA: NEGATIVE
Ketones, UA: NEGATIVE
Leukocytes, UA: NEGATIVE
Spec Grav, UA: 1.02

## 2012-02-06 LAB — POCT WET PREP WITH KOH
Clue Cells Wet Prep HPF POC: NEGATIVE
Trichomonas, UA: NEGATIVE

## 2012-02-06 NOTE — Progress Notes (Signed)
Subjective:    Patient ID: Pamela Carr, female    DOB: Aug 12, 1990, 21 y.o.   MRN: [id number]  HPI  Pt is feeling MUCH better. Not having any further discharge and her LLQ pain had mainly gone away but for the past 3d she has been experiencing LLQ fullness.  She has been having some external/labia irritation with urination but no urethral irritation - does not feel like a uti.  Used the top estradiol on her labia for sev wks and stopped having pain there - no further dyspareunia so stopped last wk.  Is still doing the 2x/wk boric acid supp. She took the miralax for a wk until it gave her diarrhea. She has never had any problems with constipation and didn't think this was contributing to her sxs.  She is approx 12d into her pill pack.  Periods are getting lighter as she has been on her birth control for sev yrs.   Past Medical History  Diagnosis Date  . Vaginal yeast infection   . UTI (urinary tract infection)     Review of Systems  Constitutional: Negative for fever, chills, diaphoresis, activity change, appetite change, fatigue and unexpected weight change.  Gastrointestinal: Negative for abdominal pain, diarrhea, constipation, blood in stool, anal bleeding and rectal pain.  Genitourinary: Positive for genital sores and pelvic pain. Negative for dysuria, urgency, frequency, hematuria, decreased urine volume, vaginal bleeding, vaginal discharge, difficulty urinating, vaginal pain, menstrual problem and dyspareunia.  Musculoskeletal: Negative for gait problem.  Skin: Negative for rash.  Hematological: Negative for adenopathy.  Psychiatric/Behavioral: The patient is not nervous/anxious.       BP 102/66  Pulse 81  Temp 97.9 F (36.6 C)  Resp 16  Ht 5\' 3"  (1.6 m)  Wt 123 lb (55.792 kg)  BMI 21.79 kg/m2  LMP 01/28/2012 Objective:   Physical Exam  Constitutional: She is oriented to person, place, and time. She appears well-developed and well-nourished. No distress.  HENT:  Head:  Normocephalic and atraumatic.  Cardiovascular: Normal rate, regular rhythm, normal heart sounds and intact distal pulses.   Pulmonary/Chest: Effort normal and breath sounds normal.  Abdominal: Soft. Bowel sounds are normal. She exhibits no distension. There is no tenderness. There is no rebound and no guarding.  Genitourinary: Uterus normal.    Pelvic exam was performed with patient supine. There is tenderness and lesion on the right labia. There is no rash on the right labia. There is no rash, tenderness or lesion on the left labia. Uterus is not tender. Cervix exhibits no motion tenderness. Right adnexum displays no mass, no tenderness and no fullness. Left adnexum displays no mass, no tenderness and no fullness. No erythema or tenderness around the vagina. No vaginal discharge found.       Subtle area of mucosal thinning on inner right labia majora  Lymphadenopathy:       Right: No inguinal adenopathy present.       Left: No inguinal adenopathy present.  Neurological: She is alert and oriented to person, place, and time.  Skin: Skin is warm and dry. She is not diaphoretic.  Psychiatric: She has a normal mood and affect. Her behavior is normal.      Results for orders placed in visit on 02/06/12  POCT WET PREP WITH KOH      Component Value Range   Trichomonas, UA Negative     Clue Cells Wet Prep HPF POC neg     Epithelial Wet Prep HPF POC 4-30  Yeast Wet Prep HPF POC neg     Bacteria Wet Prep HPF POC 2+     RBC Wet Prep HPF POC neg     WBC Wet Prep HPF POC 0-6     KOH Prep POC Negative    POCT URINALYSIS DIPSTICK      Component Value Range   Color, UA yellow     Clarity, UA clear     Glucose, UA neg     Bilirubin, UA neg     Ketones, UA neg     Spec Grav, UA 1.020     Blood, UA neg     pH, UA 6.5     Protein, UA neg     Urobilinogen, UA 0.2     Nitrite, UA neg     Leukocytes, UA Negative      Assessment & Plan:   1. Vaginal irritation  POCT Wet Prep with KOH, POCT  urinalysis dipstick  Continue using the estradiol nightly for 1-2 wks then when asymptomatic (not burning with urination or pain w/ touch) wean back to 2-3x/wk for several months. 2. Abdnormal pap - recheck in Feb - pt will sched appt here. 3. Recurrent yeast infxns - pt to cont boric acid suppositories 2x/wk for sev more months, then will try to wean off.

## 2012-02-11 ENCOUNTER — Telehealth: Payer: Self-pay

## 2012-02-11 NOTE — Telephone Encounter (Signed)
Called pt back and gave her advice from Chelle. Pt reported that she had anal sex a couple of days ago and wondered if there might be bruising or cut from that. Advised pt that unless pain gets worse or she has any discharge/bleeding, she can wait to see if she continues to have discomfort after several more days to allow for any healing to take place. Pt agreed and will come in if Sxs persist/worsen or if she is concerned.

## 2012-02-11 NOTE — Telephone Encounter (Signed)
Spoke to patient, she had one episode of  pain while having a bowel movement, felt aching inside rectal area,( pressure) near tail bone. Denies anal pain, no burning no bleeding. This went away after the bowel movement. Patient states she looked on computer and is concerned about cancer. I offered reassurance, told her single episode probably okay, but I will call her back after speaking to PA, please advise.

## 2012-02-11 NOTE — Telephone Encounter (Signed)
Dr.Shaw, Pt would like to know if she should be concerned about it hurting while she is having a bowl movement. Best# [phone]

## 2012-02-11 NOTE — Telephone Encounter (Signed)
I agree with your advice.  However, we are happy to see her if she is still concerned.

## 2012-02-13 ENCOUNTER — Ambulatory Visit (INDEPENDENT_AMBULATORY_CARE_PROVIDER_SITE_OTHER): Payer: BC Managed Care – PPO | Admitting: Physician Assistant

## 2012-02-13 VITALS — BP 86/70 | HR 81 | Temp 98.4°F | Resp 18 | Wt 127.0 lb

## 2012-02-13 DIAGNOSIS — N898 Other specified noninflammatory disorders of vagina: Secondary | ICD-10-CM

## 2012-02-13 DIAGNOSIS — R300 Dysuria: Secondary | ICD-10-CM

## 2012-02-13 DIAGNOSIS — K6289 Other specified diseases of anus and rectum: Secondary | ICD-10-CM

## 2012-02-13 LAB — POCT URINALYSIS DIPSTICK
Bilirubin, UA: NEGATIVE
Blood, UA: NEGATIVE
Glucose, UA: NEGATIVE
Leukocytes, UA: NEGATIVE
pH, UA: 5.5

## 2012-02-13 LAB — POCT WET PREP WITH KOH
Clue Cells Wet Prep HPF POC: NEGATIVE
Trichomonas, UA: NEGATIVE
Yeast Wet Prep HPF POC: NEGATIVE

## 2012-02-13 LAB — POCT UA - MICROSCOPIC ONLY
Casts, Ur, LPF, POC: NEGATIVE
Mucus, UA: NEGATIVE

## 2012-02-13 NOTE — Progress Notes (Signed)
[address] [place] [address]   Phone [phone]  Subjective:    Patient ID: Pamela Carr, female    DOB: 07/18/1990, 21 y.o.   MRN: [id number]  HPI Pt presents to clinic with anus itching and vaginal irritation.  She has had many episodes of vaginal yeast infections and she did not have one last week but with her vaginal irritation she wants to make sure she does not have one this week.  She did have sex several days ago that was rougher than normal (consensual) and she thinks that this is what is causing her vaginal symptoms.  She is also having a area at her anus that is raw. She and her boyfriend have anal sex and use toys anally and she thinks 5 days ago he scratched her by accident in her rectum while inserting a toy.  She did have pain inside her rectum but now she is just irritated on the outside.  She has not noticed any bleeding and wants to make sure she does not have a hemorrhoid or a cut because she can not see the area. She does wipe aggressively after bowel movements because she wants to ensure area is clean. She is also having some burning with urination and she thinks it is vaginal irritation but wants to have her urine checked the make sure.     Review of Systems  Gastrointestinal: Positive for rectal pain. Negative for blood in stool and anal bleeding.  Genitourinary: Positive for dysuria and vaginal pain. Negative for urgency, frequency and vaginal discharge.       Objective:   Physical Exam  Vitals reviewed. Constitutional: She is oriented to person, place, and time. She appears well-developed and well-nourished.  HENT:  Head: Normocephalic and atraumatic.  Right Ear: External ear normal.  Left Ear: External ear normal.  Pulmonary/Chest: Effort normal.  Abdominal: Soft.  Genitourinary: Rectal exam shows tenderness (mild at the area noted). Rectal exam shows no external hemorrhoid, no internal hemorrhoid (not palpable), no fissure, no mass and anal tone  normal.    Pelvic exam was performed with patient supine. No labial fusion. There is no rash, tenderness, lesion or injury on the right labia. There is no rash, tenderness, lesion or injury on the left labia. No erythema, tenderness or bleeding around the vagina. No foreign body around the vagina. No signs of injury around the vagina. Vaginal discharge (mucus d/c) found.  Neurological: She is alert and oriented to person, place, and time.  Skin: Skin is warm and dry.  Psychiatric: She has a normal mood and affect. Her behavior is normal. Judgment and thought content normal.    Results for orders placed in visit on 02/13/12  POCT WET PREP WITH KOH      Component Value Range   Trichomonas, UA Negative     Clue Cells Wet Prep HPF POC negative     Epithelial Wet Prep HPF POC 1-3     Yeast Wet Prep HPF POC negative     Bacteria Wet Prep HPF POC 2+     RBC Wet Prep HPF POC 0-3     WBC Wet Prep HPF POC TNTC     KOH Prep POC Negative    POCT URINALYSIS DIPSTICK      Component Value Range   Color, UA yellow     Clarity, UA cloudy     Glucose, UA neg     Bilirubin, UA neg     Ketones, UA trace  Spec Grav, UA 1.025     Blood, UA neg     pH, UA 5.5     Protein, UA neg     Urobilinogen, UA 0.2     Nitrite, UA neg     Leukocytes, UA Negative    POCT UA - MICROSCOPIC ONLY      Component Value Range   WBC, Ur, HPF, POC 1-4     RBC, urine, microscopic 1-2     Bacteria, U Microscopic 1+     Mucus, UA neg     Epithelial cells, urine per micros 1-3     Crystals, Ur, HPF, POC neg     Casts, Ur, LPF, POC neg     Yeast, UA neg           Assessment & Plan:   1. Vaginal irritation  POCT Wet Prep with KOH  2. Dysuria  POCT urinalysis dipstick, POCT UA - Microscopic Only  3. Anal irritation     1- related to sexual activity change - pt to continue use of her estrogen cream for the next month as rx last visit.   2- checked urine - not a good clean catch - but feel like dysuria is  coming from vaginal irritation 3- think related to change in sexual activity - can use hydrocortisone cream nightly for the next 2-3 nights to decrease irritation.  She could also try wet wipes instead of toliet paper because of her aggressive wiping style.

## 2012-02-29 ENCOUNTER — Telehealth: Payer: Self-pay

## 2012-02-29 ENCOUNTER — Ambulatory Visit (INDEPENDENT_AMBULATORY_CARE_PROVIDER_SITE_OTHER): Payer: BC Managed Care – PPO | Admitting: Family Medicine

## 2012-02-29 VITALS — BP 112/74 | HR 70 | Temp 98.6°F | Resp 16 | Ht 62.0 in | Wt 124.0 lb

## 2012-02-29 DIAGNOSIS — B379 Candidiasis, unspecified: Secondary | ICD-10-CM

## 2012-02-29 DIAGNOSIS — B9689 Other specified bacterial agents as the cause of diseases classified elsewhere: Secondary | ICD-10-CM

## 2012-02-29 DIAGNOSIS — B49 Unspecified mycosis: Secondary | ICD-10-CM

## 2012-02-29 DIAGNOSIS — N760 Acute vaginitis: Secondary | ICD-10-CM

## 2012-02-29 DIAGNOSIS — R300 Dysuria: Secondary | ICD-10-CM

## 2012-02-29 DIAGNOSIS — A499 Bacterial infection, unspecified: Secondary | ICD-10-CM

## 2012-02-29 LAB — POCT URINALYSIS DIPSTICK
Blood, UA: NEGATIVE
Glucose, UA: NEGATIVE
Ketones, UA: NEGATIVE
Spec Grav, UA: 1.01
Urobilinogen, UA: 0.2

## 2012-02-29 LAB — POCT UA - MICROSCOPIC ONLY
Crystals, Ur, HPF, POC: NEGATIVE
Mucus, UA: NEGATIVE

## 2012-02-29 LAB — POCT WET PREP WITH KOH
KOH Prep POC: POSITIVE
RBC Wet Prep HPF POC: NEGATIVE
Trichomonas, UA: NEGATIVE
Yeast Wet Prep HPF POC: NEGATIVE

## 2012-02-29 MED ORDER — METRONIDAZOLE 500 MG PO TABS: 500.0000 mg | ORAL_TABLET | Freq: Two times a day (BID) | ORAL | Status: DC

## 2012-02-29 MED ORDER — FLUCONAZOLE 150 MG PO TABS: 150.0000 mg | ORAL_TABLET | Freq: Once | ORAL | Status: DC

## 2012-02-29 NOTE — Progress Notes (Addendum)
[facility]:  Office Visit  Chief Complaint:  Chief Complaint  Patient presents with  . screening for    UTI, Yeast, BV    HPI: Pamela Carr is a 21 y.o. female who complains of  Burning urination x 2 days, wants to know if she has BV and/or yeast since she has a history of it the past several months. Takes Boric Acid suppositories which has helped she thinks. Sexually active, used to get yeast infections a lot and has had one episode of BV, She thinks this current episode may have been caused by stress from her family during the holidays. She takes every precaution possible, no longer wears tight underwear thongs , only wears cotton panties, does not douche, urinates before and after sex, does not have h/o STDs ( last STD screening in 07/2011 were negative). She has no other sxs, ie odor, rashes, abd/pelvic pain, itching, fevers, chills, increase frequency or urgency.   Past Medical History  Diagnosis Date  . Vaginal yeast infection   . UTI (urinary tract infection)    Past Surgical History  Procedure Date  . Fracture surgery    History   Social History  . Marital Status: Single    Spouse Name: N/A    Number of Children: N/A  . Years of Education: N/A   Social History Main Topics  . Smoking status: Never Smoker   . Smokeless tobacco: Never Used  . Alcohol Use: Yes     Comment: 2 x per month  . Drug Use: No  . Sexually Active: Yes    Birth Control/ Protection: Pill   Other Topics Concern  . None   Social History Narrative  . None   Family History  Problem Relation Age of Onset  . Allergies Father   . Hypertension Maternal Grandmother   . Asthma Maternal Grandfather   . Allergies Maternal Grandfather   . Arthritis Paternal Grandmother    No Known Allergies Prior to Admission medications   Medication Sig Start Date End Date Taking? Authorizing Provider  AMBULATORY NON FORMULARY MEDICATION Medication Name: Boric Acid Capsules 600mg   Insert 1  capsule into the vagina twice each week. 11/20/11  Yes Kirkland Hun, MD  clobetasol cream (TEMOVATE) 0.05 % Apply topically 2 (two) times daily as needed. 07/30/11 07/29/12 Yes Kirkland Hun, MD  estradiol (ESTRACE) 0.1 MG/GM vaginal cream Place 2 g vaginally daily as needed.   Yes Historical Provider, MD  TRI-PREVIFEM 0.18/0.215/0.25 MG-35 MCG tablet  02/05/12  Yes Historical Provider, MD     ROS: The patient denies fevers, chills, night sweats, unintentional weight loss, chest pain, palpitations, wheezing, dyspnea on exertion, nausea, vomiting, abdominal pain, hematuria, melena, numbness, weakness, or tingling.  All other systems have been reviewed and were otherwise negative with the exception of those mentioned in the HPI and as above.    PHYSICAL EXAM: Filed Vitals:   02/29/12 1024  BP: 112/74  Pulse: 70  Temp: 98.6 F (37 C)  Resp: 16   Filed Vitals:   02/29/12 1024  Height: 5\' 2"  (1.575 m)  Weight: 124 lb (56.246 kg)   Body mass index is 22.68 kg/(m^2).  General: Alert, no acute distress, anxious white female HEENT:  Normocephalic, atraumatic, oropharynx patent.  Cardiovascular:  Regular rate and rhythm, no rubs murmurs or gallops.   No pedal edema.  Respiratory: Clear to auscultation bilaterally.  No wheezes, rales, or rhonchi.  No cyanosis, no use of accessory musculature GI: No organomegaly, abdomen  is soft and non-tender, positive bowel sounds.  No masses. Skin: No rashes. Neurologic: Facial musculature symmetric. Psychiatric: Patient is appropriate throughout our interaction. Lymphatic: No cervical lymphadenopathy Musculoskeletal: Gait intact. Gu-+ minimal thin white dc, no CMT, cervix was nl, no strwberry cervix, no bubbles/mucosal changes, no lesions, masses, no irritation/abrasions of vaginal vault, no odor   LABS: Results for orders placed in visit on 02/29/12  POCT URINALYSIS DIPSTICK      Component Value Range   Color, UA yellow     Clarity, UA clear      Glucose, UA neg     Bilirubin, UA neg     Ketones, UA neg     Spec Grav, UA 1.010     Blood, UA neg     pH, UA 6.5     Protein, UA neg     Urobilinogen, UA 0.2     Nitrite, UA neg     Leukocytes, UA Negative    POCT UA - MICROSCOPIC ONLY      Component Value Range   WBC, Ur, HPF, POC 0-1     RBC, urine, microscopic 0-1     Bacteria, U Microscopic trace     Mucus, UA neg     Epithelial cells, urine per micros 0-3     Crystals, Ur, HPF, POC neg     Casts, Ur, LPF, POC neg     Yeast, UA neg    POCT WET PREP WITH KOH      Component Value Range   Trichomonas, UA Negative     Clue Cells Wet Prep HPF POC 6-8     Epithelial Wet Prep HPF POC 6-10     Yeast Wet Prep HPF POC neg     Bacteria Wet Prep HPF POC 3+     RBC Wet Prep HPF POC neg     WBC Wet Prep HPF POC 1-3     KOH Prep POC Positive       EKG/XRAY:   Primary read interpreted by Dr. Conley Rolls at [facility].   ASSESSMENT/PLAN: Encounter Diagnoses  Name Primary?  . Dysuria Yes  . Bacterial vaginosis   . Yeast infection    [name] with recurrent yeast infections and BV: I told patient that she has both yeast and clue cells on her microscopic exam, her pelvic exam was pretty unremarkable. Did not have odor, minimal white dc. She is pretty asymptomatic except for minimal dysuria x 2 days. Once I told her the results she became more anxious and distressed.  I explained to her that she can try taking the yeast medication first, the Diflucan and see if she feels better, then if she doesn't she can take the Flagyl. She states that she used to get yeast infxns all the time and has only had one episode of BV.  I also gave her an additional Diflucan to take if she has yeast like sxs after taking Flagyl.   Rx Diflucan 150 mg x 2 pills and Flagyl 500 mg BID x 7 days May continue with Boric Acid suppositories prn/if desires F/u prn   [name], DO 02/29/2012 11:46 AM

## 2012-02-29 NOTE — Telephone Encounter (Signed)
Pt wants to talk only to dr Katrinka Blazing - pt was seen today by another provider and is not clear on medications and pt instructions. Pt states she usually sees dr Katrinka Blazing and she knows her history and ONLY wants to talk with dr Katrinka Blazing about what to do.

## 2012-02-29 NOTE — Telephone Encounter (Signed)
Pt wanted to know if she can take the medications together and if she start now and clarification on what she had.

## 2012-03-12 ENCOUNTER — Ambulatory Visit (INDEPENDENT_AMBULATORY_CARE_PROVIDER_SITE_OTHER): Payer: BC Managed Care – PPO | Admitting: Family Medicine

## 2012-03-12 ENCOUNTER — Encounter: Payer: Self-pay | Admitting: Family Medicine

## 2012-03-12 VITALS — BP 98/56 | HR 104 | Temp 97.9°F | Resp 16 | Ht 62.5 in | Wt 122.0 lb

## 2012-03-12 DIAGNOSIS — R110 Nausea: Secondary | ICD-10-CM

## 2012-03-12 DIAGNOSIS — B351 Tinea unguium: Secondary | ICD-10-CM

## 2012-03-12 DIAGNOSIS — B373 Candidiasis of vulva and vagina: Secondary | ICD-10-CM

## 2012-03-12 DIAGNOSIS — F419 Anxiety disorder, unspecified: Secondary | ICD-10-CM

## 2012-03-12 DIAGNOSIS — R109 Unspecified abdominal pain: Secondary | ICD-10-CM

## 2012-03-12 DIAGNOSIS — N898 Other specified noninflammatory disorders of vagina: Secondary | ICD-10-CM

## 2012-03-12 DIAGNOSIS — F411 Generalized anxiety disorder: Secondary | ICD-10-CM

## 2012-03-12 LAB — POCT UA - MICROSCOPIC ONLY
Mucus, UA: NEGATIVE
Yeast, UA: NEGATIVE

## 2012-03-12 LAB — POCT URINALYSIS DIPSTICK
Bilirubin, UA: NEGATIVE
Blood, UA: NEGATIVE
Glucose, UA: NEGATIVE
Nitrite, UA: NEGATIVE

## 2012-03-12 LAB — POCT WET PREP WITH KOH: Trichomonas, UA: NEGATIVE

## 2012-03-12 LAB — POCT CBC
Hemoglobin: 14.7 g/dL (ref 12.2–16.2)
MCHC: 31.4 g/dL — AB (ref 31.8–35.4)
MPV: 9.5 fL (ref 0–99.8)
POC Granulocyte: 2.9 (ref 2–6.9)
POC MID %: 5.2 %M (ref 0–12)
Platelet Count, POC: 265 10*3/uL (ref 142–424)
RBC: 5.24 M/uL (ref 4.04–5.48)

## 2012-03-12 MED ORDER — FLUCONAZOLE 150 MG PO TABS: 150.0000 mg | ORAL_TABLET | Freq: Once | ORAL | Status: DC

## 2012-03-12 MED ORDER — RANITIDINE HCL 150 MG PO TABS: 150.0000 mg | ORAL_TABLET | Freq: Two times a day (BID) | ORAL | Status: DC

## 2012-03-12 MED ORDER — CLONAZEPAM 0.5 MG PO TABS: 0.2500 mg | ORAL_TABLET | Freq: Two times a day (BID) | ORAL | Status: DC | PRN

## 2012-03-12 NOTE — Progress Notes (Signed)
Subjective:    Patient ID: Pamela Carr, female    DOB: February 14, 1991, 21 y.o.   MRN: [id number] Chief Complaint  Patient presents with  . Anxiety    stomach pain    HPI    Review of Systems    BP 98/56  Pulse 104  Temp(Src) 97.9 F (36.6 C) (Oral)  Resp 16  Ht 5' 2.5" (1.588 m)  Wt 122 lb (55.339 kg)  BMI 21.94 kg/m2  SpO2 98%  LMP 02/18/2012 Objective:   Physical Exam        Results for orders placed in visit on 03/12/12  POCT CBC      Component Value Range   WBC 4.2 (*) 4.6 - 10.2 K/uL   Lymph, poc 1.1  0.6 - 3.4   POC LYMPH PERCENT 26.2  10 - 50 %L   MID (cbc) 0.2  0 - 0.9   POC MID % 5.2  0 - 12 %M   POC Granulocyte 2.9  2 - 6.9   Granulocyte percent 68.6  37 - 80 %G   RBC 5.24  4.04 - 5.48 M/uL   Hemoglobin 14.7  12.2 - 16.2 g/dL   HCT, POC 40.9  81.1 - 47.9 %   MCV 89.4  80 - 97 fL   MCH, POC 28.1  27 - 31.2 pg   MCHC 31.4 (*) 31.8 - 35.4 g/dL   RDW, POC 91.4     Platelet Count, POC 265  142 - 424 K/uL   MPV 9.5  0 - 99.8 fL  POCT UA - MICROSCOPIC ONLY      Component Value Range   WBC, Ur, HPF, POC 0-1     RBC, urine, microscopic 0-1     Bacteria, U Microscopic trace     Mucus, UA neg     Epithelial cells, urine per micros 0-5     Crystals, Ur, HPF, POC neg     Casts, Ur, LPF, POC neg     Yeast, UA neg    POCT URINALYSIS DIPSTICK      Component Value Range   Color, UA yellow     Clarity, UA clear     Glucose, UA neg     Bilirubin, UA neg     Ketones, UA neg     Spec Grav, UA 1.015     Blood, UA neg     pH, UA 7.0     Protein, UA neg     Urobilinogen, UA 0.2     Nitrite, UA neg     Leukocytes, UA Negative    POCT WET PREP WITH KOH      Component Value Range   Trichomonas, UA Negative     Clue Cells Wet Prep HPF POC 0-15     Epithelial Wet Prep HPF POC 2-28     Yeast Wet Prep HPF POC neg     Bacteria Wet Prep HPF POC 2+     RBC Wet Prep HPF POC 0-5     WBC Wet Prep HPF POC 0-6     KOH Prep POC Positive      Assessment & Plan:   Nausea - Plan: Comprehensive metabolic panel  Abdominal pain - Plan: POCT CBC, H. pylori antibody, IgG, DISCONTINUED: ranitidine (ZANTAC) 150 MG tablet  Anxiety - Plan: clonazePAM (KLONOPIN) 0.5 MG tablet  Vaginal irritation - Plan: POCT UA - Microscopic Only, POCT urinalysis dipstick, POCT Wet Prep with KOH  Yeast infection of the vagina - Plan:  Fungus culture w smear, DISCONTINUED: fluconazole (DIFLUCAN) 150 MG tablet  Meds ordered this encounter  Medications  . DISCONTD: ranitidine (ZANTAC) 150 MG tablet    Sig: Take 1 tablet (150 mg total) by mouth 2 (two) times daily.    Dispense:  60 tablet    Refill:  2  . clonazePAM (KLONOPIN) 0.5 MG tablet    Sig: Take 0.5 tablets (0.25 mg total) by mouth 2 (two) times daily as needed for anxiety.    Dispense:  20 tablet    Refill:  1  . DISCONTD: fluconazole (DIFLUCAN) 150 MG tablet    Sig: Take 1 tablet (150 mg total) by mouth once. Repeat every 3d x 3    Dispense:  3 tablet    Refill:  0

## 2012-03-13 LAB — COMPREHENSIVE METABOLIC PANEL
ALT: 14 U/L (ref 0–35)
AST: 19 U/L (ref 0–37)
Albumin: 3.7 g/dL (ref 3.5–5.2)
Alkaline Phosphatase: 56 U/L (ref 39–117)
Calcium: 8.6 mg/dL (ref 8.4–10.5)
Chloride: 103 mEq/L (ref 96–112)
Potassium: 3.6 mEq/L (ref 3.5–5.3)
Sodium: 137 mEq/L (ref 135–145)
Total Protein: 6.1 g/dL (ref 6.0–8.3)

## 2012-03-15 ENCOUNTER — Encounter: Payer: Self-pay | Admitting: Family Medicine

## 2012-03-15 LAB — H. PYLORI ANTIBODY, IGG: H Pylori IgG: 0.66 {ISR}

## 2012-03-18 ENCOUNTER — Other Ambulatory Visit: Payer: Self-pay | Admitting: Family Medicine

## 2012-03-18 DIAGNOSIS — B373 Candidiasis of vulva and vagina: Secondary | ICD-10-CM

## 2012-03-18 MED ORDER — FLUCONAZOLE 150 MG PO TABS: 150.0000 mg | ORAL_TABLET | Freq: Once | ORAL | Status: DC

## 2012-03-27 ENCOUNTER — Ambulatory Visit (INDEPENDENT_AMBULATORY_CARE_PROVIDER_SITE_OTHER): Payer: BC Managed Care – PPO | Admitting: Family Medicine

## 2012-03-27 VITALS — BP 107/72 | HR 75 | Temp 98.1°F | Resp 16 | Ht 62.5 in | Wt 122.2 lb

## 2012-03-27 DIAGNOSIS — N898 Other specified noninflammatory disorders of vagina: Secondary | ICD-10-CM

## 2012-03-27 DIAGNOSIS — R300 Dysuria: Secondary | ICD-10-CM

## 2012-03-27 LAB — POCT UA - MICROSCOPIC ONLY: Crystals, Ur, HPF, POC: NEGATIVE

## 2012-03-27 LAB — POCT URINALYSIS DIPSTICK
Bilirubin, UA: NEGATIVE
Glucose, UA: NEGATIVE
Ketones, UA: NEGATIVE
Leukocytes, UA: NEGATIVE
Protein, UA: NEGATIVE

## 2012-03-27 LAB — POCT WET PREP WITH KOH
KOH Prep POC: NEGATIVE
Trichomonas, UA: NEGATIVE
Yeast Wet Prep HPF POC: NEGATIVE

## 2012-03-27 NOTE — Progress Notes (Signed)
Subjective:    Patient ID: Pamela Carr, female    DOB: 1990/08/27, 21 y.o.   MRN: [id number]  HPI  Just started experiencing some vaginal burning last night, esp with urination, though this morning sxs are not as bad.  She has also developed little yellow thick discharge this morning and has had occasional twinges of pelvic pain in her right adnexa. Rectal area is also itching.  Past Medical History  Diagnosis Date  . Vaginal yeast infection   . UTI (urinary tract infection)    Past Surgical History  Procedure Date  . Fracture surgery    Current Outpatient Prescriptions on File Prior to Visit  Medication Sig Dispense Refill  . AMBULATORY NON FORMULARY MEDICATION Medication Name: Boric Acid Capsules 600mg   Insert 1 capsule into the vagina twice each week.  24 capsule  12  . clobetasol cream (TEMOVATE) 0.05 % Apply topically 2 (two) times daily as needed.      . clonazePAM (KLONOPIN) 0.5 MG tablet Take 0.5 tablets (0.25 mg total) by mouth 2 (two) times daily as needed for anxiety.  20 tablet  1  . estradiol (ESTRACE) 0.1 MG/GM vaginal cream Place 2 g vaginally daily as needed.      . fluconazole (DIFLUCAN) 150 MG tablet Take 1 tablet (150 mg total) by mouth once. Repeat every 3d x 3 doses then start taking 1 tab/wk  15 tablet  0  . metroNIDAZOLE (FLAGYL) 500 MG tablet Take 1 tablet (500 mg total) by mouth 2 (two) times daily.  14 tablet  0  . ranitidine (ZANTAC) 150 MG tablet Take 1 tablet (150 mg total) by mouth 2 (two) times daily.  60 tablet  2  . TRI-PREVIFEM 0.18/0.215/0.25 MG-35 MCG tablet       . No Known Allergies   Review of Systems  Constitutional: Negative for fever, chills, diaphoresis, activity change, appetite change, fatigue and unexpected weight change.  Gastrointestinal: Negative for abdominal pain, diarrhea, constipation, blood in stool, anal bleeding and rectal pain.  Genitourinary: Positive for vaginal discharge, vaginal pain and pelvic pain. Negative for  dysuria, urgency, frequency, hematuria, decreased urine volume, vaginal bleeding, difficulty urinating, genital sores, menstrual problem and dyspareunia.  Musculoskeletal: Negative for gait problem.  Skin: Negative for rash.  Hematological: Negative for adenopathy.  Psychiatric/Behavioral: The patient is not nervous/anxious.        Objective:   Physical Exam  Constitutional: She is oriented to person, place, and time. She appears well-developed and well-nourished. No distress.  HENT:  Head: Normocephalic and atraumatic.  Cardiovascular: Normal rate, regular rhythm, normal heart sounds and intact distal pulses.   Pulmonary/Chest: Effort normal and breath sounds normal.  Abdominal: Soft. Bowel sounds are normal. She exhibits no distension. There is no tenderness. There is no rebound and no guarding.  Genitourinary: Uterus normal. Pelvic exam was performed with patient supine. There is no rash, tenderness or lesion on the right labia. There is no rash, tenderness or lesion on the left labia. Cervix exhibits no motion tenderness and no friability. Right adnexum displays no mass, no tenderness and no fullness. Left adnexum displays no mass, no tenderness and no fullness. No erythema or tenderness around the vagina. No vaginal discharge found.  Lymphadenopathy:       Right: No inguinal adenopathy present.       Left: No inguinal adenopathy present.  Neurological: She is alert and oriented to person, place, and time.  Skin: Skin is warm and dry. She is not diaphoretic.  Psychiatric:  She has a normal mood and affect. Her behavior is normal.            Results for orders placed in visit on 03/27/12  POCT UA - MICROSCOPIC ONLY      Component Value Range   WBC, Ur, HPF, POC 4-8     RBC, urine, microscopic 1-3     Bacteria, U Microscopic small     Mucus, UA small     Epithelial cells, urine per micros 2-4     Crystals, Ur, HPF, POC neg     Casts, Ur, LPF, POC neg     Yeast, UA neg    POCT  URINALYSIS DIPSTICK      Component Value Range   Color, UA yellow     Clarity, UA clear     Glucose, UA neg     Bilirubin, UA neg     Ketones, UA neg     Spec Grav, UA 1.020     Blood, UA trace     pH, UA 6.5     Protein, UA neg     Urobilinogen, UA 0.2     Nitrite, UA neg     Leukocytes, UA Negative    POCT WET PREP WITH KOH      Component Value Range   Trichomonas, UA Negative     Clue Cells Wet Prep HPF POC 1-2     Epithelial Wet Prep HPF POC 3-6     Yeast Wet Prep HPF POC neg     Bacteria Wet Prep HPF POC 1+     RBC Wet Prep HPF POC 0-2     WBC Wet Prep HPF POC 5-10     KOH Prep POC Negative      Assessment & Plan:   1. Dysuria  POCT UA - Microscopic Only, POCT urinalysis dipstick, Urine culture  2. Vaginal discharge  POCT Wet Prep with KOH  Pt reassured that no abnormalities seen on exam - will send urine off for culture since there was a small amount of bacteria and wbcs on microsocpy.  Note - urine returned + for E. Coli infection - pt treated w/ macrobid x 1 wk. Restart diflucan 150mg  q3d x 3 doses then resume once wkly diflucan dosing.

## 2012-03-28 ENCOUNTER — Other Ambulatory Visit: Payer: Self-pay | Admitting: Family Medicine

## 2012-03-28 DIAGNOSIS — N390 Urinary tract infection, site not specified: Secondary | ICD-10-CM

## 2012-03-28 MED ORDER — NITROFURANTOIN MONOHYD MACRO 100 MG PO CAPS: 100.0000 mg | ORAL_CAPSULE | Freq: Two times a day (BID) | ORAL | Status: AC

## 2012-03-29 ENCOUNTER — Encounter: Payer: Self-pay | Admitting: Family Medicine

## 2012-03-29 ENCOUNTER — Telehealth: Payer: Self-pay

## 2012-03-29 LAB — URINE CULTURE

## 2012-03-29 NOTE — Telephone Encounter (Signed)
DR Clelia Croft  PT STATES SHE WOKE UP THIS MORNING AND IS HAVING MORE VAGINAL DISCOMFORT, BURNING, ITCHING. SHE IS IN SO MUCH DISCOMFORT SHE CANNOT SIT.

## 2012-03-29 NOTE — Telephone Encounter (Signed)
Dr. Clelia Croft has sent in [facility] for patient because she has a UTI.  Medication is at the pharmacy to be picked up.  Take as directed.  Use diflucan for three doses for yeast infection.

## 2012-03-29 NOTE — Telephone Encounter (Signed)
PT STATES SHE IS HAVING MORE VAGINAL DISCOMFORT, BURNING ITCHING AND CANNOT EVEN SIT DOWN-NO BETTER SINCE LAST VISIT WOULD LIKE TO SEE IF SOMETHING CAN BE CALLED IN FOR HER    PLEASE ADVISE   [phone] PT PH   [facility] [address]

## 2012-03-29 NOTE — Telephone Encounter (Signed)
She also has a phone message

## 2012-03-29 NOTE — Telephone Encounter (Signed)
Pt got medication and will start.  She also got lab results.

## 2012-04-09 ENCOUNTER — Ambulatory Visit (INDEPENDENT_AMBULATORY_CARE_PROVIDER_SITE_OTHER): Payer: BC Managed Care – PPO | Admitting: Family Medicine

## 2012-04-09 ENCOUNTER — Encounter: Payer: Self-pay | Admitting: Family Medicine

## 2012-04-09 ENCOUNTER — Telehealth: Payer: Self-pay | Admitting: Physician Assistant

## 2012-04-09 VITALS — BP 100/52 | HR 83 | Temp 98.7°F | Resp 16 | Ht 62.0 in | Wt 123.6 lb

## 2012-04-09 DIAGNOSIS — N390 Urinary tract infection, site not specified: Secondary | ICD-10-CM

## 2012-04-09 DIAGNOSIS — B351 Tinea unguium: Secondary | ICD-10-CM

## 2012-04-09 DIAGNOSIS — N898 Other specified noninflammatory disorders of vagina: Secondary | ICD-10-CM

## 2012-04-09 DIAGNOSIS — B373 Candidiasis of vulva and vagina: Secondary | ICD-10-CM

## 2012-04-09 LAB — POCT URINALYSIS DIPSTICK
Bilirubin, UA: NEGATIVE
Blood, UA: NEGATIVE
Glucose, UA: NEGATIVE
Ketones, UA: NEGATIVE
Leukocytes, UA: NEGATIVE
Nitrite, UA: NEGATIVE
Protein, UA: NEGATIVE
Spec Grav, UA: 1.015
Urobilinogen, UA: 0.2
pH, UA: 5.5

## 2012-04-09 LAB — POCT WET PREP WITH KOH: RBC Wet Prep HPF POC: NEGATIVE

## 2012-04-09 LAB — POCT UA - MICROSCOPIC ONLY
Bacteria, U Microscopic: NEGATIVE
Casts, Ur, LPF, POC: NEGATIVE
Crystals, Ur, HPF, POC: NEGATIVE
Mucus, UA: NEGATIVE
WBC, Ur, HPF, POC: NEGATIVE
Yeast, UA: NEGATIVE

## 2012-04-09 MED ORDER — CLOTRIMAZOLE 100 MG VA TABS: 2.0000 | ORAL_TABLET | Freq: Every day | VAGINAL | Status: DC

## 2012-04-09 NOTE — Progress Notes (Signed)
  Subjective:    Patient ID: Pamela Carr, female    DOB: 21-Apr-1990, 22 y.o.   MRN: [id number]  Chief Complaint  Patient presents with  . Urinary Tract Infection    follow up   HPI    Review of Systems    BP 100/52  Pulse 83  Temp(Src) 98.7 F (37.1 C) (Oral)  Resp 16  Ht 5\' 2"  (1.575 m)  Wt 123 lb 9.6 oz (56.065 kg)  BMI 22.6 kg/m2  SpO2 98%  LMP 03/23/2012 Objective:   Physical Exam        Results for orders placed in visit on 04/09/12  POCT UA - MICROSCOPIC ONLY      Component Value Range   WBC, Ur, HPF, POC neg     RBC, urine, microscopic 0-1     Bacteria, U Microscopic neg     Mucus, UA neg     Epithelial cells, urine per micros 0-2     Crystals, Ur, HPF, POC neg     Casts, Ur, LPF, POC neg     Yeast, UA neg    POCT URINALYSIS DIPSTICK      Component Value Range   Color, UA yellow     Clarity, UA clear     Glucose, UA neg     Bilirubin, UA neg     Ketones, UA neg     Spec Grav, UA 1.015     Blood, UA neg     pH, UA 5.5     Protein, UA neg     Urobilinogen, UA 0.2     Nitrite, UA neg     Leukocytes, UA Negative    POCT WET PREP WITH KOH      Component Value Range   Trichomonas, UA Negative     Clue Cells Wet Prep HPF POC 0-5     Epithelial Wet Prep HPF POC 0-16     Yeast Wet Prep HPF POC neg     Bacteria Wet Prep HPF POC 2+     RBC Wet Prep HPF POC neg     WBC Wet Prep HPF POC 0-4     KOH Prep POC Positive      Assessment & Plan:  UTI (lower urinary tract infection) - Plan: POCT UA - Microscopic Only, POCT urinalysis dipstick  Vaginal discharge - Plan: POCT Wet Prep with KOH  Yeast vaginitis - Plan: Clotrimazole 100 MG TABS  Meds ordered this encounter  Medications  . Multiple Vitamin (MULTIVITAMIN) tablet    Sig: Take 1 tablet by mouth daily.  Marland Kitchen OVER THE COUNTER MEDICATION    Sig: Cranberry supplement  . OVER THE COUNTER MEDICATION    Sig: Probiotic taking daily  . Clotrimazole 100 MG TABS    Sig: Place 2 tablets (200 mg  total) vaginally daily.    Dispense:  20 each    Refill:  0

## 2012-04-09 NOTE — Telephone Encounter (Signed)
Dr. Clelia Croft - [facility] regarding the prescription for clotrimazole intravaginal tabs, apparently this is not available.  The pharmacist said they didn't have any alternatives.  Clotrimazole cream is available OTC, so pt will get this tonight.  Let me know if there is a different medication that you would like sent in for her

## 2012-04-10 ENCOUNTER — Encounter: Payer: Self-pay | Admitting: Family Medicine

## 2012-04-11 ENCOUNTER — Encounter: Payer: Self-pay | Admitting: Family Medicine

## 2012-04-11 LAB — FUNGUS CULTURE W SMEAR: Smear Result: NONE SEEN

## 2012-04-12 ENCOUNTER — Other Ambulatory Visit: Payer: Self-pay | Admitting: Family Medicine

## 2012-04-12 DIAGNOSIS — B373 Candidiasis of vulva and vagina: Secondary | ICD-10-CM

## 2012-04-12 MED ORDER — MICONAZOLE NITRATE 200 MG VA SUPP: 200.0000 mg | Freq: Every day | VAGINAL | Status: DC

## 2012-04-12 NOTE — Telephone Encounter (Signed)
Pt states that otc clotrimazole burns so tried on miconazole. If for some reason, this doesn't work for her, can try terazol.

## 2012-04-26 ENCOUNTER — Ambulatory Visit (INDEPENDENT_AMBULATORY_CARE_PROVIDER_SITE_OTHER): Payer: BC Managed Care – PPO | Admitting: Family Medicine

## 2012-04-26 ENCOUNTER — Encounter: Payer: Self-pay | Admitting: Family Medicine

## 2012-04-26 VITALS — BP 95/65 | HR 86 | Temp 98.0°F | Resp 16 | Ht 62.5 in | Wt 125.6 lb

## 2012-04-26 DIAGNOSIS — N23 Unspecified renal colic: Secondary | ICD-10-CM

## 2012-04-26 DIAGNOSIS — B373 Candidiasis of vulva and vagina: Secondary | ICD-10-CM

## 2012-04-26 DIAGNOSIS — N898 Other specified noninflammatory disorders of vagina: Secondary | ICD-10-CM

## 2012-04-26 DIAGNOSIS — R309 Painful micturition, unspecified: Secondary | ICD-10-CM

## 2012-04-26 LAB — POCT URINALYSIS DIPSTICK
Bilirubin, UA: NEGATIVE
Glucose, UA: NEGATIVE
Ketones, UA: NEGATIVE
Leukocytes, UA: NEGATIVE
Nitrite, UA: NEGATIVE
Protein, UA: NEGATIVE
Spec Grav, UA: >=1.03
Urobilinogen, UA: 0.2
pH, UA: 5.5

## 2012-04-26 LAB — POCT UA - MICROSCOPIC ONLY
Casts, Ur, LPF, POC: NEGATIVE
Crystals, Ur, HPF, POC: NEGATIVE
Mucus, UA: NEGATIVE
Yeast, UA: NEGATIVE

## 2012-04-26 LAB — POCT WET PREP WITH KOH
Trichomonas, UA: NEGATIVE
Yeast Wet Prep HPF POC: NEGATIVE

## 2012-04-26 MED ORDER — FLUCONAZOLE 150 MG PO TABS: 150.0000 mg | ORAL_TABLET | ORAL | Status: DC

## 2012-04-26 NOTE — Progress Notes (Signed)
Subjective:    Patient ID: Pamela Carr, female    DOB: June 27, 1990, 22 y.o.   MRN: [id number]  HPI  Pamela Carr is a 22 yo who I have been seeing for recurrent vaginal burning. She has had many yeast infections so has been started on once weekly preventative diflucan but has also had an episode of BV and a UTI in the past several mos, which is not as common for her. We tried to culture the yeast to ensure it was a typical infection, but culture returned negative.  She developed repeat vaginal burning which started about 2d ago.  Also has pain on left side - flank LLQ area but not in her left adenexa - it feels similar to when she had BV - is tender even with light touch like clothes or a sheet brush against it - doesn't know if it is nervous habit of grabbing side. even shirt is irritationg it but no rash or bruises.  LMP was 1/15 -  Feels better than yest No vag discharge. Using diflucan once a wk - only has about 6 wks left.  Boyfriend, [name], who I saw as a pt once has had some tinea cruris. It has not itched to much but has darker splotches over genitals and inner thighs. He used topical antifungal otc with some result but has stopped now.  Past Medical History  Diagnosis Date  . Vaginal yeast infection   . UTI (urinary tract infection)    Current Outpatient Prescriptions on File Prior to Visit  Medication Sig Dispense Refill  . Multiple Vitamin (MULTIVITAMIN) tablet Take 1 tablet by mouth daily.      Marland Kitchen OVER THE COUNTER MEDICATION Cranberry supplement      . OVER THE COUNTER MEDICATION Probiotic taking daily      . ranitidine (ZANTAC) 150 MG tablet Take 1 tablet (150 mg total) by mouth 2 (two) times daily.  60 tablet  2  . TRI-PREVIFEM 0.18/0.215/0.25 MG-35 MCG tablet       . AMBULATORY NON FORMULARY MEDICATION Medication Name: Boric Acid Capsules 600mg   Insert 1 capsule into the vagina twice each week.  24 capsule  12  . clobetasol cream (TEMOVATE) 0.05 % Apply topically 2 (two) times daily  as needed.      . clonazePAM (KLONOPIN) 0.5 MG tablet Take 0.5 tablets (0.25 mg total) by mouth 2 (two) times daily as needed for anxiety.  20 tablet  1  . estradiol (ESTRACE) 0.1 MG/GM vaginal cream Place 2 g vaginally daily as needed.      . miconazole (MICONAZOLE 3) 200 MG vaginal suppository Place 1 suppository (200 mg total) vaginally at bedtime.  3 suppository  1   No Known Allergies  Review of Systems  Constitutional: Negative for fever, chills, diaphoresis, activity change, appetite change, fatigue and unexpected weight change.  Gastrointestinal: Positive for abdominal pain. Negative for diarrhea, constipation, blood in stool, anal bleeding and rectal pain.  Genitourinary: Positive for flank pain and vaginal pain. Negative for dysuria, urgency, frequency, hematuria, decreased urine volume, vaginal bleeding, vaginal discharge, enuresis, difficulty urinating, genital sores, menstrual problem, pelvic pain and dyspareunia.  Musculoskeletal: Negative for gait problem.  Skin: Negative for color change, pallor, rash and wound.  Hematological: Negative for adenopathy.  Psychiatric/Behavioral: The patient is not nervous/anxious.       BP 95/65  Pulse 86  Temp 98 F (36.7 C) (Oral)  Resp 16  Ht 5' 2.5" (1.588 m)  Wt 125 lb 9.6 oz (56.972 kg)  BMI 22.61 kg/m2  SpO2 100%  LMP 03/23/2012 Objective:   Physical Exam  Constitutional: She is oriented to person, place, and time. She appears well-developed and well-nourished. No distress.  HENT:  Head: Normocephalic and atraumatic.  Cardiovascular: Normal rate, regular rhythm, normal heart sounds and intact distal pulses.   Pulmonary/Chest: Effort normal and breath sounds normal.  Abdominal: Soft. Bowel sounds are normal. She exhibits no distension. There is no tenderness. There is no rebound and no guarding.  Genitourinary: Uterus normal. Pelvic exam was performed with patient supine. There is no rash, tenderness or lesion on the right  labia. There is no rash, tenderness or lesion on the left labia. Uterus is not deviated, not enlarged, not fixed and not tender. Cervix exhibits discharge. Cervix exhibits no motion tenderness and no friability. Right adnexum displays no mass, no tenderness and no fullness. Left adnexum displays no mass, no tenderness and no fullness. No erythema or tenderness around the vagina. Vaginal discharge found.       Thick yellow mucous discharge of moderate amount  Lymphadenopathy:       Right: No inguinal adenopathy present.       Left: No inguinal adenopathy present.  Neurological: She is alert and oriented to person, place, and time.  Skin: Skin is warm and dry. She is not diaphoretic.  Psychiatric: She has a normal mood and affect. Her behavior is normal.      Results for orders placed in visit on 04/26/12  POCT URINALYSIS DIPSTICK      Component Value Range   Color, UA yellow     Clarity, UA clear     Glucose, UA neg     Bilirubin, UA neg     Ketones, UA neg     Spec Grav, UA >=1.030     Blood, UA trace-intact     pH, UA 5.5     Protein, UA neg     Urobilinogen, UA 0.2     Nitrite, UA neg     Leukocytes, UA Negative    POCT UA - MICROSCOPIC ONLY      Component Value Range   WBC, Ur, HPF, POC 0-1     RBC, urine, microscopic 0-1     Bacteria, U Microscopic trace     Mucus, UA neg     Epithelial cells, urine per micros 1-3     Crystals, Ur, HPF, POC neg     Casts, Ur, LPF, POC neg     Yeast, UA neg    POCT WET PREP WITH KOH      Component Value Range   Trichomonas, UA Negative     Clue Cells Wet Prep HPF POC 0-2     Epithelial Wet Prep HPF POC 4-8     Yeast Wet Prep HPF POC negative     Bacteria Wet Prep HPF POC small     RBC Wet Prep HPF POC negative     WBC Wet Prep HPF POC 2-5     KOH Prep POC Negative    whiff test negative PH 5.5  Assessment & Plan:  Vaginal discharge - no cause found on lab eval. Cont once weekly diflucan - refill. Perhaps boyfriend should try  diflucan then topical treatment with diflucan although there is no proven benefit to treating partner will try anyway as pt has such a prolonged course.  Meds ordered this encounter  Medications  . fluconazole (DIFLUCAN) 150 MG tablet    Sig: Take 1 tablet (150  mg total) by mouth once a week.    Dispense:  12 tablet    Refill:  1

## 2012-04-30 ENCOUNTER — Ambulatory Visit (INDEPENDENT_AMBULATORY_CARE_PROVIDER_SITE_OTHER): Payer: BC Managed Care – PPO | Admitting: Family Medicine

## 2012-04-30 ENCOUNTER — Encounter: Payer: Self-pay | Admitting: Family Medicine

## 2012-04-30 VITALS — BP 92/52 | HR 93 | Temp 97.1°F | Resp 16 | Ht 63.0 in | Wt 127.0 lb

## 2012-04-30 DIAGNOSIS — N760 Acute vaginitis: Secondary | ICD-10-CM

## 2012-04-30 DIAGNOSIS — J069 Acute upper respiratory infection, unspecified: Secondary | ICD-10-CM

## 2012-04-30 DIAGNOSIS — B9689 Other specified bacterial agents as the cause of diseases classified elsewhere: Secondary | ICD-10-CM

## 2012-04-30 DIAGNOSIS — N898 Other specified noninflammatory disorders of vagina: Secondary | ICD-10-CM

## 2012-04-30 LAB — POCT URINALYSIS DIPSTICK
Bilirubin, UA: NEGATIVE
Blood, UA: NEGATIVE
Glucose, UA: NEGATIVE
Ketones, UA: NEGATIVE
Spec Grav, UA: >=1.03
Urobilinogen, UA: 0.2

## 2012-04-30 LAB — POCT WET PREP WITH KOH
RBC Wet Prep HPF POC: NEGATIVE
Yeast Wet Prep HPF POC: NEGATIVE

## 2012-04-30 LAB — POCT UA - MICROSCOPIC ONLY
Amorphous: POSITIVE
Casts, Ur, LPF, POC: NEGATIVE
Crystals, Ur, HPF, POC: NEGATIVE

## 2012-04-30 MED ORDER — METRONIDAZOLE 500 MG PO TABS: 500.0000 mg | ORAL_TABLET | Freq: Two times a day (BID) | ORAL | Status: DC

## 2012-04-30 MED ORDER — GUAIFENESIN-CODEINE 100-10 MG/5ML PO SYRP: 5.0000 mL | ORAL_SOLUTION | Freq: Four times a day (QID) | ORAL | Status: DC | PRN

## 2012-04-30 MED ORDER — IPRATROPIUM BROMIDE 0.03 % NA SOLN: 2.0000 | Freq: Four times a day (QID) | NASAL | Status: DC

## 2012-04-30 MED ORDER — MUCINEX DM MAXIMUM STRENGTH 60-1200 MG PO TB12: 1.0000 | ORAL_TABLET | Freq: Two times a day (BID) | ORAL | Status: DC

## 2012-04-30 MED ORDER — PSEUDOEPHEDRINE HCL ER 120 MG PO TB12: 120.0000 mg | ORAL_TABLET | Freq: Two times a day (BID) | ORAL | Status: DC

## 2012-04-30 NOTE — Progress Notes (Signed)
Subjective:    Patient ID: Pamela Carr, female    DOB: 31-Dec-1990, 22 y.o.   MRN: [id number]  HPI  Has not felt ill in 3 yrs but day after coming to urgent care on Mon she started developing URI sxs and she woke up Wed and felt joint achiness. Had a lot of sinus pressure coughing and some congestion though no rhinorrhea.  Did get temp up to 99.6 which is high for her since she is normally around 97.1.  Tried zicam and vitamin C several times.  Cough persists even through she went to work yesterday and then cough kept her up the whole night and now abs and back are sore with the beginning of nasal congestion. Slept a lot on wed and then last night Is having some more vaginal burning today.  Past Medical History  Diagnosis Date  . Vaginal yeast infection   . UTI (urinary tract infection)    Current Outpatient Prescriptions on File Prior to Visit  Medication Sig Dispense Refill  . clobetasol cream (TEMOVATE) 0.05 % Apply topically 2 (two) times daily as needed.      . clonazePAM (KLONOPIN) 0.5 MG tablet Take 0.5 tablets (0.25 mg total) by mouth 2 (two) times daily as needed for anxiety.  20 tablet  1  . estradiol (ESTRACE) 0.1 MG/GM vaginal cream Place 2 g vaginally daily as needed.      . fluconazole (DIFLUCAN) 150 MG tablet Take 1 tablet (150 mg total) by mouth once a week.  12 tablet  1  . Multiple Vitamin (MULTIVITAMIN) tablet Take 1 tablet by mouth daily.      Marland Kitchen OVER THE COUNTER MEDICATION Cranberry supplement      . OVER THE COUNTER MEDICATION Probiotic taking daily      . ranitidine (ZANTAC) 150 MG tablet Take 1 tablet (150 mg total) by mouth 2 (two) times daily.  60 tablet  2  . TRI-PREVIFEM 0.18/0.215/0.25 MG-35 MCG tablet       . AMBULATORY NON FORMULARY MEDICATION Medication Name: Boric Acid Capsules 600mg   Insert 1 capsule into the vagina twice each week.  24 capsule  12  \      . miconazole (MICONAZOLE 3) 200 MG vaginal suppository Place 1 suppository (200 mg total) vaginally  at bedtime.  3 suppository  1   No Known Allergies   Review of Systems  Constitutional: Negative for fever, chills, diaphoresis, activity change, appetite change, fatigue and unexpected weight change.  HENT: Positive for congestion, sore throat and postnasal drip. Negative for ear pain, rhinorrhea, sneezing, trouble swallowing, neck pain, neck stiffness, voice change and sinus pressure.   Respiratory: Positive for cough. Negative for chest tightness, shortness of breath and wheezing.   Cardiovascular: Positive for chest pain.  Gastrointestinal: Positive for abdominal pain. Negative for diarrhea, constipation, blood in stool, anal bleeding and rectal pain.  Genitourinary: Positive for flank pain, vaginal discharge and vaginal pain. Negative for dysuria, urgency, frequency, hematuria, decreased urine volume, vaginal bleeding, enuresis, difficulty urinating, genital sores, menstrual problem, pelvic pain and dyspareunia.  Musculoskeletal: Negative for gait problem.  Skin: Negative for rash.  Hematological: Negative for adenopathy.  Psychiatric/Behavioral: The patient is not nervous/anxious.       BP 92/52  Pulse 93  Temp 97.1 F (36.2 C)  Resp 16  Ht 5\' 3"  (1.6 m)  Wt 127 lb (57.607 kg)  BMI 22.50 kg/m2  LMP 04/07/2012 Objective:   Physical Exam  Constitutional: She is oriented to person, place, and  time. She appears well-developed and well-nourished. No distress.  HENT:  Head: Normocephalic and atraumatic.  Right Ear: Tympanic membrane, external ear and ear canal normal.  Left Ear: Tympanic membrane, external ear and ear canal normal.  Nose: Mucosal edema and rhinorrhea present. Right sinus exhibits no maxillary sinus tenderness and no frontal sinus tenderness. Left sinus exhibits no maxillary sinus tenderness and no frontal sinus tenderness.  Mouth/Throat: Uvula is midline, oropharynx is clear and moist and mucous membranes are normal. No oropharyngeal exudate.  Eyes: Conjunctivae  normal are normal. Right eye exhibits no discharge. Left eye exhibits no discharge. No scleral icterus.  Neck: Normal range of motion. Neck supple.  Cardiovascular: Normal rate, regular rhythm, normal heart sounds and intact distal pulses.   Pulmonary/Chest: Effort normal and breath sounds normal.  Lymphadenopathy:    She has no cervical adenopathy.  Neurological: She is alert and oriented to person, place, and time.  Skin: Skin is warm and dry. She is not diaphoretic. No erythema.  Psychiatric: She has a normal mood and affect. Her behavior is normal.          Results for orders placed in visit on 04/30/12  POCT WET PREP WITH KOH      Component Value Range   Trichomonas, UA Negative     Clue Cells Wet Prep HPF POC 20%     Epithelial Wet Prep HPF POC 1-3     Yeast Wet Prep HPF POC neg     Bacteria Wet Prep HPF POC 2+     RBC Wet Prep HPF POC neg     WBC Wet Prep HPF POC 1-3     KOH Prep POC Negative    POCT UA - MICROSCOPIC ONLY      Component Value Range   WBC, Ur, HPF, POC 0-1     RBC, urine, microscopic 0-1     Bacteria, U Microscopic neg     Mucus, UA neg     Epithelial cells, urine per micros neg     Crystals, Ur, HPF, POC neg     Casts, Ur, LPF, POC neg     Yeast, UA neg     Amorphous pos    POCT URINALYSIS DIPSTICK      Component Value Range   Color, UA yellow     Clarity, UA clear     Glucose, UA neg     Bilirubin, UA neg     Ketones, UA neg     Spec Grav, UA >=1.030     Blood, UA neg     pH, UA 6.5     Protein, UA neg     Urobilinogen, UA 0.2     Nitrite, UA neg     Leukocytes, UA Negative     Assessment & Plan:   1. URI, acute  ipratropium (ATROVENT) 0.03 % nasal spray, guaiFENesin-codeine (GUAIATUSSIN AC) 100-10 MG/5ML syrup, pseudoephedrine (SUDAFED 12 HOUR) 120 MG 12 hr tablet, Dextromethorphan-Guaifenesin (MUCINEX DM MAXIMUM STRENGTH) 60-1200 MG TB12  2. Vaginal discharge  POCT Wet Prep with KOH, POCT UA - Microscopic Only, POCT urinalysis dipstick   3. Bacterial vaginosis  metroNIDAZOLE (FLAGYL) 500 MG tablet   Meds ordered this encounter  Medications  . ipratropium (ATROVENT) 0.03 % nasal spray    Sig: Place 2 sprays into the nose 4 (four) times daily.    Dispense:  30 mL    Refill:  1  . guaiFENesin-codeine (GUAIATUSSIN AC) 100-10 MG/5ML syrup    Sig: Take  5 mLs by mouth 4 (four) times daily as needed for cough.    Dispense:  120 mL    Refill:  0  . pseudoephedrine (SUDAFED 12 HOUR) 120 MG 12 hr tablet    Sig: Take 1 tablet (120 mg total) by mouth every 12 (twelve) hours.    Dispense:  30 tablet    Refill:  0  . Dextromethorphan-Guaifenesin (MUCINEX DM MAXIMUM STRENGTH) 60-1200 MG TB12    Sig: Take 1 tablet by mouth every 12 (twelve) hours.    Dispense:  30 each    Refill:  0  . metroNIDAZOLE (FLAGYL) 500 MG tablet    Sig: Take 1 tablet (500 mg total) by mouth 2 (two) times daily with a meal. DO NOT CONSUME ALCOHOL WHILE TAKING THIS MEDICATION.    Dispense:  14 tablet    Refill:  0

## 2012-04-30 NOTE — Patient Instructions (Signed)
Yeast Arrest, oil of oregano, and olive leaf extract - lets look into these a little more.

## 2012-05-15 ENCOUNTER — Other Ambulatory Visit: Payer: Self-pay

## 2012-05-18 ENCOUNTER — Encounter: Payer: Self-pay | Admitting: Family Medicine

## 2012-05-18 ENCOUNTER — Ambulatory Visit (INDEPENDENT_AMBULATORY_CARE_PROVIDER_SITE_OTHER): Payer: BC Managed Care – PPO | Admitting: Family Medicine

## 2012-05-18 VITALS — BP 94/64 | HR 76 | Temp 98.0°F | Resp 16 | Ht 63.0 in | Wt 125.0 lb

## 2012-05-18 DIAGNOSIS — R300 Dysuria: Secondary | ICD-10-CM

## 2012-05-18 DIAGNOSIS — N760 Acute vaginitis: Secondary | ICD-10-CM

## 2012-05-18 DIAGNOSIS — N762 Acute vulvitis: Secondary | ICD-10-CM

## 2012-05-18 LAB — POCT UA - MICROSCOPIC ONLY
Bacteria, U Microscopic: NEGATIVE
Casts, Ur, LPF, POC: NEGATIVE
Crystals, Ur, HPF, POC: NEGATIVE
Mucus, UA: NEGATIVE
RBC, urine, microscopic: NEGATIVE
Yeast, UA: NEGATIVE

## 2012-05-18 LAB — POCT URINALYSIS DIPSTICK
Bilirubin, UA: NEGATIVE
Blood, UA: NEGATIVE
Glucose, UA: NEGATIVE
Ketones, UA: NEGATIVE
Leukocytes, UA: NEGATIVE
Nitrite, UA: NEGATIVE
Protein, UA: NEGATIVE
Spec Grav, UA: 1.01
Urobilinogen, UA: 0.2
pH, UA: 6

## 2012-05-18 LAB — POCT WET PREP WITH KOH
Trichomonas, UA: NEGATIVE
Yeast Wet Prep HPF POC: NEGATIVE

## 2012-05-18 NOTE — Progress Notes (Signed)
  Subjective:    Patient ID: Pamela Carr, female    DOB: [birth year], 22 y.o.   MRN: [id number] Chief Complaint  Patient presents with  . Dysuria    x 3 days   . Vaginal Discharge    odor  x 1 week     HPI  3 days ago she finished her period - we treated her for a URI and BV - and then when she finished her antibiotic she started her period.  Then she developed burning for the past few days - boric acid today for the first time.  Last took diflucan Saturday - she has been taking it once a wk and will do weekly for 6 mos.  A lot of discharge after boric acid but other than that has not.  A little burning with urination and she has had an odor that even her boyfriend noticed.     Review of Systems    BP 94/64  Pulse 76  Temp(Src) 98 F (36.7 C) (Oral)  Resp 16  Ht 5\' 3"  (1.6 m)  Wt 125 lb (56.7 kg)  BMI 22.15 kg/m2  SpO2 98%  LMP 04/07/2012 Objective:   Physical Exam        Results for orders placed in visit on 05/18/12  POCT UA - MICROSCOPIC ONLY      Result Value Range   WBC, Ur, HPF, POC 0-1     RBC, urine, microscopic neg     Bacteria, U Microscopic neg     Mucus, UA neg     Epithelial cells, urine per micros 0-2     Crystals, Ur, HPF, POC neg     Casts, Ur, LPF, POC neg     Yeast, UA neg    POCT URINALYSIS DIPSTICK      Result Value Range   Color, UA yellow     Clarity, UA clear     Glucose, UA neg     Bilirubin, UA neg     Ketones, UA neg     Spec Grav, UA 1.010     Blood, UA neg     pH, UA 6.0     Protein, UA neg     Urobilinogen, UA 0.2     Nitrite, UA neg     Leukocytes, UA Negative    POCT WET PREP WITH KOH      Result Value Range   Trichomonas, UA Negative     Clue Cells Wet Prep HPF POC negative     Epithelial Wet Prep HPF POC 2-4     Yeast Wet Prep HPF POC negative     Bacteria Wet Prep HPF POC trace     RBC Wet Prep HPF POC 0-1     WBC Wet Prep HPF POC 1-2     KOH Prep POC Negative      Assessment & Plan:  Dysuria - Plan: POCT UA -  Microscopic Only, POCT urinalysis dipstick, POCT Wet Prep with KOH, Urine culture  Vulvitis - Plan: POCT Wet Prep with KOH, Urine culture  Meds ordered this encounter  Medications  . ranitidine (ZANTAC) 150 MG tablet    Sig: Take 150 mg by mouth as needed.

## 2012-05-20 ENCOUNTER — Encounter: Payer: Self-pay | Admitting: Family Medicine

## 2012-05-25 ENCOUNTER — Ambulatory Visit (INDEPENDENT_AMBULATORY_CARE_PROVIDER_SITE_OTHER): Payer: BC Managed Care – PPO | Admitting: Family Medicine

## 2012-05-25 VITALS — BP 107/68 | HR 91 | Temp 98.3°F | Resp 18 | Ht 63.0 in | Wt 125.0 lb

## 2012-05-25 DIAGNOSIS — N760 Acute vaginitis: Secondary | ICD-10-CM

## 2012-05-25 DIAGNOSIS — B9689 Other specified bacterial agents as the cause of diseases classified elsewhere: Secondary | ICD-10-CM

## 2012-05-25 DIAGNOSIS — R300 Dysuria: Secondary | ICD-10-CM

## 2012-05-25 LAB — POCT WET PREP WITH KOH
KOH Prep POC: NEGATIVE
Trichomonas, UA: NEGATIVE

## 2012-05-25 LAB — POCT URINALYSIS DIPSTICK
Bilirubin, UA: NEGATIVE
Leukocytes, UA: NEGATIVE
Nitrite, UA: NEGATIVE
Protein, UA: NEGATIVE
Urobilinogen, UA: 0.2
pH, UA: 6.5

## 2012-05-25 LAB — POCT UA - MICROSCOPIC ONLY
Casts, Ur, LPF, POC: NEGATIVE
Crystals, Ur, HPF, POC: NEGATIVE
Yeast, UA: NEGATIVE

## 2012-05-25 NOTE — Progress Notes (Signed)
Subjective:    Patient ID: Pamela Carr, female    DOB: 02-09-1991, 22 y.o.   MRN: [id number] Chief Complaint  Patient presents with  . Follow-up    Dr Clelia Croft- BV    HPI  Slight burning when urination still since last week - which she could be normal for her since she is sexually active but since she has had so many yeast infections - with occ BV and UTIs - she would like to be checked.  Since the exam last wk - she has not used any further products or treatments - no additional boric acid supp.  Has never had to use the klonopin I rx'ed her.  Past Medical History  Diagnosis Date  . Vaginal yeast infection   . UTI (urinary tract infection)    Current Outpatient Prescriptions on File Prior to Visit  Medication Sig Dispense Refill  . AMBULATORY NON FORMULARY MEDICATION Medication Name: Boric Acid Capsules 600mg   Insert 1 capsule into the vagina twice each week.  24 capsule  12  . clobetasol cream (TEMOVATE) 0.05 % Apply topically 2 (two) times daily as needed.      . clonazePAM (KLONOPIN) 0.5 MG tablet Take 0.5 tablets (0.25 mg total) by mouth 2 (two) times daily as needed for anxiety.  20 tablet  1  . estradiol (ESTRACE) 0.1 MG/GM vaginal cream Place 2 g vaginally daily as needed.      . Multiple Vitamin (MULTIVITAMIN) tablet Take 1 tablet by mouth daily.      Marland Kitchen OVER THE COUNTER MEDICATION Cranberry supplement      . OVER THE COUNTER MEDICATION Probiotic taking daily      . ranitidine (ZANTAC) 150 MG tablet Take 150 mg by mouth as needed.      . TRI-PREVIFEM 0.18/0.215/0.25 MG-35 MCG tablet       . fluconazole (DIFLUCAN) 150 MG tablet Take 1 tablet (150 mg total) by mouth once a week.  12 tablet  1  . metroNIDAZOLE (FLAGYL) 500 MG tablet Take 1 tablet (500 mg total) by mouth 2 (two) times daily with a meal. DO NOT CONSUME ALCOHOL WHILE TAKING THIS MEDICATION.  14 tablet  0  . miconazole (MICONAZOLE 3) 200 MG vaginal suppository Place 1 suppository (200 mg total) vaginally at bedtime.   3 suppository  1   No current facility-administered medications on file prior to visit.   No Known Allergies   Review of Systems  Constitutional: Negative for fever, chills, diaphoresis, activity change, appetite change, fatigue and unexpected weight change.  Gastrointestinal: Negative for abdominal pain, diarrhea, constipation, blood in stool, anal bleeding and rectal pain.  Genitourinary: Positive for dysuria and vaginal discharge. Negative for urgency, frequency, hematuria, flank pain, decreased urine volume, vaginal bleeding, enuresis, difficulty urinating, genital sores, vaginal pain, menstrual problem, pelvic pain and dyspareunia.  Musculoskeletal: Negative for gait problem.  Skin: Negative for rash.  Hematological: Negative for adenopathy.  Psychiatric/Behavioral: The patient is not nervous/anxious.        BP 107/68  Pulse 91  Temp(Src) 98.3 F (36.8 C) (Oral)  Resp 18  Ht 5\' 3"  (1.6 m)  Wt 125 lb (56.7 kg)  BMI 22.15 kg/m2  SpO2 100%  LMP 05/06/2012 Objective:   Physical Exam  Constitutional: She is oriented to person, place, and time. She appears well-developed and well-nourished. No distress.  HENT:  Head: Normocephalic and atraumatic.  Right Ear: External ear normal.  Left Ear: External ear normal.  Eyes: Conjunctivae and EOM are normal. Right eye  exhibits no discharge. Left eye exhibits no discharge. No scleral icterus.  Pulmonary/Chest: Effort normal.  Abdominal: Soft. Bowel sounds are normal. She exhibits no distension. There is no tenderness. There is no rebound and no guarding.  Genitourinary: Uterus normal. Pelvic exam was performed with patient supine. There is tenderness on the right labia. There is no rash or lesion on the right labia. There is no rash, tenderness or lesion on the left labia. Cervix exhibits no motion tenderness and no friability. Right adnexum displays no mass, no tenderness and no fullness. Left adnexum displays no mass, no tenderness and no  fullness. No erythema or tenderness around the vagina. Vaginal discharge found.  Yellow-white thin mucoid discharge present  Lymphadenopathy:       Right: No inguinal adenopathy present.       Left: No inguinal adenopathy present.  Neurological: She is alert and oriented to person, place, and time.  Skin: Skin is warm and dry. She is not diaphoretic.  Psychiatric: She has a normal mood and affect. Her behavior is normal.       Results for orders placed in visit on 05/25/12  POCT WET PREP WITH KOH      Result Value Range   Trichomonas, UA Negative     Clue Cells Wet Prep HPF POC neg     Epithelial Wet Prep HPF POC 0-1     Yeast Wet Prep HPF POC neg     Bacteria Wet Prep HPF POC trace     RBC Wet Prep HPF POC neg     WBC Wet Prep HPF POC 0-1     KOH Prep POC Negative    POCT URINALYSIS DIPSTICK      Result Value Range   Color, UA yellow     Clarity, UA clear     Glucose, UA neg     Bilirubin, UA neg     Ketones, UA trace     Spec Grav, UA 1.025     Blood, UA neg     pH, UA 6.5     Protein, UA neg     Urobilinogen, UA 0.2     Nitrite, UA neg     Leukocytes, UA Negative    POCT UA - MICROSCOPIC ONLY      Result Value Range   WBC, Ur, HPF, POC neg     RBC, urine, microscopic neg     Bacteria, U Microscopic neg     Mucus, UA neg     Epithelial cells, urine per micros 0-1     Crystals, Ur, HPF, POC neg     Casts, Ur, LPF, POC neg     Yeast, UA neg      Assessment & Plan:  Burning with urination - Plan: POCT urinalysis dipstick, POCT UA - Microscopic Only, POCT Wet Prep with KOH  Testing nml - discharge likely physiologic. Pt will cont once wkly diflucan until June and not wash her perineum with anything but hot water as she is doing well - has gone the longest w/o a yeast infection for >30yr.   She will restart the topical estradiol cream twice a wk for the inner labia minora tenderness on right.  H/o abnml pap smear - in June 2013 pt had ASCUS pap but was HPV negative  and is in a monogamous relationship so we will recheck in 1 yr as likely due to her recurrent yeast infections and poss boric acid use at that time.

## 2012-06-02 ENCOUNTER — Encounter: Payer: Self-pay | Admitting: Family Medicine

## 2012-06-03 ENCOUNTER — Encounter: Payer: Self-pay | Admitting: Family Medicine

## 2012-06-03 ENCOUNTER — Ambulatory Visit (INDEPENDENT_AMBULATORY_CARE_PROVIDER_SITE_OTHER): Payer: BC Managed Care – PPO | Admitting: Physician Assistant

## 2012-06-03 VITALS — BP 94/68 | HR 77 | Temp 96.7°F | Resp 16 | Ht 63.0 in | Wt 124.0 lb

## 2012-06-03 DIAGNOSIS — R300 Dysuria: Secondary | ICD-10-CM

## 2012-06-03 DIAGNOSIS — F938 Other childhood emotional disorders: Secondary | ICD-10-CM

## 2012-06-03 DIAGNOSIS — K5900 Constipation, unspecified: Secondary | ICD-10-CM

## 2012-06-03 DIAGNOSIS — N898 Other specified noninflammatory disorders of vagina: Secondary | ICD-10-CM

## 2012-06-03 DIAGNOSIS — R198 Other specified symptoms and signs involving the digestive system and abdomen: Secondary | ICD-10-CM

## 2012-06-03 DIAGNOSIS — Z639 Problem related to primary support group, unspecified: Secondary | ICD-10-CM

## 2012-06-03 LAB — POCT WET PREP WITH KOH
KOH Prep POC: NEGATIVE
Trichomonas, UA: NEGATIVE
Yeast Wet Prep HPF POC: NEGATIVE

## 2012-06-03 LAB — POCT URINALYSIS DIPSTICK
Glucose, UA: NEGATIVE
Ketones, UA: NEGATIVE
Leukocytes, UA: NEGATIVE
Spec Grav, UA: >=1.03

## 2012-06-03 LAB — POCT UA - MICROSCOPIC ONLY

## 2012-06-03 LAB — IFOBT (OCCULT BLOOD): IFOBT: NEGATIVE

## 2012-06-03 NOTE — Progress Notes (Signed)
Subjective:    Patient ID: Pamela Carr, female    DOB: 03/20/1991, 22 y.o.   MRN: [id number]  HPI 22 yr old CF presents with vaginal burning. We talked about sexual practices and sexual health for over 40 mins face to face. She usu has IC about 5 times a week.  She and her partner of 7 years are having some problems lately and they have had sex less frequently. She and her partner have only been with each other-neither have had other partners.  She also pain a large bowel movement this morning that caused an aching pain after defecation. She has not had any blood in her stool.   Review of Systems  Genitourinary: Positive for dysuria. Negative for vaginal bleeding, vaginal discharge, genital sores, pelvic pain and dyspareunia.  All other systems reviewed and are negative.       Objective:   Physical Exam  Nursing note and vitals reviewed. Constitutional: She is oriented to person, place, and time. She appears well-developed and well-nourished.  HENT:  Head: Normocephalic and atraumatic.  Neck: Normal range of motion. Neck supple.  Cardiovascular: Normal rate, regular rhythm and normal heart sounds.   Pulmonary/Chest: Effort normal and breath sounds normal.  Abdominal: Soft. Bowel sounds are normal.  Genitourinary: Vagina normal and uterus normal. No vaginal discharge found.  Some erythema and tenderness at R introitus. Otherwise vaginal mucosa in tact and WNL.  Cervix appears normal and bimanual is unremarkable. Wet prep is taken. Rectal exam is WNL  Neurological: She is alert and oriented to person, place, and time.  Skin: Skin is warm and dry.  Psychiatric: She has a normal mood and affect. Her behavior is normal.    Results for orders placed in visit on 06/03/12  POCT UA - MICROSCOPIC ONLY      Result Value Range   WBC, Ur, HPF, POC 0-2     RBC, urine, microscopic 0-4     Bacteria, U Microscopic trace     Mucus, UA neg     Epithelial cells, urine per micros 0-4     Crystals, Ur, HPF, POC neg     Casts, Ur, LPF, POC neg     Yeast, UA neg    POCT URINALYSIS DIPSTICK      Result Value Range   Color, UA yellow     Clarity, UA clear     Glucose, UA neg     Bilirubin, UA neg     Ketones, UA neg     Spec Grav, UA >=1.030     Blood, UA trace-lysed     pH, UA 5.5     Protein, UA neg     Urobilinogen, UA 0.2     Nitrite, UA neg     Leukocytes, UA Negative    POCT WET PREP WITH KOH      Result Value Range   Trichomonas, UA Negative     Clue Cells Wet Prep HPF POC 0-3     Epithelial Wet Prep HPF POC 7-32     Yeast Wet Prep HPF POC neg     Bacteria Wet Prep HPF POC 2+     RBC Wet Prep HPF POC 0-2     WBC Wet Prep HPF POC 0-4     KOH Prep POC Negative    IFOBT (OCCULT BLOOD)      Result Value Range   IFOBT Negative        Assessment & Plan:  Recurrent vaginal burning-I think  this is more situational/meachanical/sexually related than illness at this point.  We discussed using water based lubricants, decreased contact with vagina and actual ejaculate.  She will f/up with me in 1 month. Pain with defecation with a negative hemosure.  This is most likely related to the large stool she passed this morning.  She will increase water and fiber intake and we will investigate further if it continues.

## 2012-06-03 NOTE — Telephone Encounter (Signed)
Can we call patient to make appt with Dr Clelia Croft?

## 2012-06-05 LAB — URINE CULTURE: Organism ID, Bacteria: NO GROWTH

## 2012-06-06 ENCOUNTER — Ambulatory Visit (INDEPENDENT_AMBULATORY_CARE_PROVIDER_SITE_OTHER): Payer: BC Managed Care – PPO | Admitting: Family Medicine

## 2012-06-06 VITALS — BP 112/78 | HR 84 | Temp 98.0°F | Resp 16 | Ht 63.0 in | Wt 124.0 lb

## 2012-06-06 DIAGNOSIS — R300 Dysuria: Secondary | ICD-10-CM

## 2012-06-06 DIAGNOSIS — N899 Noninflammatory disorder of vagina, unspecified: Secondary | ICD-10-CM

## 2012-06-06 DIAGNOSIS — Z79899 Other long term (current) drug therapy: Secondary | ICD-10-CM

## 2012-06-06 DIAGNOSIS — N898 Other specified noninflammatory disorders of vagina: Secondary | ICD-10-CM

## 2012-06-06 LAB — POCT URINALYSIS DIPSTICK
Bilirubin, UA: NEGATIVE
Glucose, UA: NEGATIVE
Spec Grav, UA: 1.02
pH, UA: 6.5

## 2012-06-06 LAB — POCT WET PREP WITH KOH
Clue Cells Wet Prep HPF POC: NEGATIVE
KOH Prep POC: NEGATIVE
Trichomonas, UA: NEGATIVE
Yeast Wet Prep HPF POC: NEGATIVE

## 2012-06-06 LAB — POCT UA - MICROSCOPIC ONLY
Bacteria, U Microscopic: NEGATIVE
Epithelial cells, urine per micros: NEGATIVE
Mucus, UA: NEGATIVE
WBC, Ur, HPF, POC: NEGATIVE
Yeast, UA: NEGATIVE

## 2012-06-06 NOTE — Progress Notes (Signed)
Subjective:    Patient ID: Pamela Carr, female    DOB: 1991-02-20, 22 y.o.   MRN: [id number] Chief Complaint  Patient presents with  . Follow-up    HPI  She still feels like something is coming on and she has had her period this wk.  Sxs have not improved and she is going away this wk.  The skin closer to the anal region is still burning a little.  She took a diflucan yesterday. Burning is with urination only.  She is not having any discharge.  When she was 22 yo she was tried on LoEstrin and then spotted the whole month - since then she has been stable on tri-previfem.  Past Medical History  Diagnosis Date  . Vaginal yeast infection   . UTI (urinary tract infection)    Current Outpatient Prescriptions on File Prior to Visit  Medication Sig Dispense Refill  . AMBULATORY NON FORMULARY MEDICATION Medication Name: Boric Acid Capsules 600mg   Insert 1 capsule into the vagina twice each week.  24 capsule  12  . clobetasol cream (TEMOVATE) 0.05 % Apply topically 2 (two) times daily as needed.      . clonazePAM (KLONOPIN) 0.5 MG tablet Take 0.5 tablets (0.25 mg total) by mouth 2 (two) times daily as needed for anxiety.  20 tablet  1  . estradiol (ESTRACE) 0.1 MG/GM vaginal cream Place 2 g vaginally daily as needed.      . fluconazole (DIFLUCAN) 150 MG tablet Take 1 tablet (150 mg total) by mouth once a week.  12 tablet  1  . metroNIDAZOLE (FLAGYL) 500 MG tablet Take 1 tablet (500 mg total) by mouth 2 (two) times daily with a meal. DO NOT CONSUME ALCOHOL WHILE TAKING THIS MEDICATION.  14 tablet  0  . miconazole (MICONAZOLE 3) 200 MG vaginal suppository Place 1 suppository (200 mg total) vaginally at bedtime.  3 suppository  1  . Multiple Vitamin (MULTIVITAMIN) tablet Take 1 tablet by mouth daily.      Marland Kitchen OVER THE COUNTER MEDICATION Cranberry supplement      . OVER THE COUNTER MEDICATION Probiotic taking daily      . ranitidine (ZANTAC) 150 MG tablet Take 150 mg by mouth as needed.      .  TRI-PREVIFEM 0.18/0.215/0.25 MG-35 MCG tablet        No current facility-administered medications on file prior to visit.   No Known Allergies   Review of Systems  Constitutional: Negative for fever, chills, diaphoresis, activity change, appetite change, fatigue and unexpected weight change.  Cardiovascular: Negative for leg swelling.  Gastrointestinal: Negative for nausea, vomiting, abdominal pain, diarrhea and constipation.  Genitourinary: Positive for dysuria, vaginal discharge, genital sores and vaginal pain. Negative for urgency, frequency, hematuria, flank pain, decreased urine volume, vaginal bleeding, enuresis, difficulty urinating, menstrual problem, pelvic pain and dyspareunia.  Allergic/Immunologic: Positive for environmental allergies.  Hematological: Negative for adenopathy. Does not bruise/bleed easily.  Psychiatric/Behavioral: Negative for sleep disturbance, self-injury and dysphoric mood. The patient is not nervous/anxious.       BP 112/78  Pulse 84  Temp(Src) 98 F (36.7 C) (Oral)  Resp 16  Ht 5\' 3"  (1.6 m)  Wt 124 lb (56.246 kg)  BMI 21.97 kg/m2  SpO2 98%  LMP 05/06/2012 Objective:   Physical Exam  Constitutional: She is oriented to person, place, and time. She appears well-developed and well-nourished. No distress.  HENT:  Head: Normocephalic and atraumatic.  Cardiovascular: Normal rate, regular rhythm, normal heart sounds and intact  distal pulses.   Pulmonary/Chest: Effort normal and breath sounds normal.  Abdominal: Soft. Bowel sounds are normal. She exhibits no distension. There is no tenderness. There is no rebound and no guarding.  Genitourinary: Pelvic exam was performed with patient supine. No labial fusion. There is rash and tenderness on the right labia. There is no lesion or injury on the right labia. There is no rash, tenderness, lesion or injury on the left labia. Cervix exhibits discharge. Cervix exhibits no friability. No erythema or tenderness  around the vagina. Vaginal discharge found.  Yellow mucoid discharge, moderate amount  Lymphadenopathy:       Right: No inguinal adenopathy present.       Left: No inguinal adenopathy present.  Neurological: She is alert and oriented to person, place, and time.  Skin: Skin is warm and dry. She is not diaphoretic.  Psychiatric: She has a normal mood and affect. Her behavior is normal.      Results for orders placed in visit on 06/06/12  POCT WET PREP WITH KOH      Result Value Range   Trichomonas, UA Negative     Clue Cells Wet Prep HPF POC neg     Epithelial Wet Prep HPF POC 8-11     Yeast Wet Prep HPF POC neg     Bacteria Wet Prep HPF POC 3+     RBC Wet Prep HPF POC neg     WBC Wet Prep HPF POC 1-3     KOH Prep POC Negative    POCT UA - MICROSCOPIC ONLY      Result Value Range   WBC, Ur, HPF, POC neg     RBC, urine, microscopic 0-1     Bacteria, U Microscopic neg     Mucus, UA neg     Epithelial cells, urine per micros neg     Crystals, Ur, HPF, POC neg     Casts, Ur, LPF, POC neg     Yeast, UA neg    POCT URINALYSIS DIPSTICK      Result Value Range   Color, UA yellow     Clarity, UA clear     Glucose, UA neg     Bilirubin, UA neg     Ketones, UA neg     Spec Grav, UA 1.020     Blood, UA trace     pH, UA 6.5     Protein, UA neg     Urobilinogen, UA 0.2     Nitrite, UA neg     Leukocytes, UA Negative      Assessment & Plan:  Dysuria - Plan: POCT UA - Microscopic Only, POCT urinalysis dipstick, POCT urine pregnancy, Urine culture  Vaginal irritation - Plan: POCT Wet Prep with KOH - restart top estrogen x 1 wk  Encounter for long-term (current) use of other medications - Plan: Hepatic Function Panel since on fluconazole for sev mos.  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  HUGE for her - she was miserable with them every few wks for over a yr.  We will plan to stop the  fluconazole once she has been on it for 6 mos - so June.  I do wonder if her birthcontrol pills could be exacerbating things so if she does restart to get recurrent yeast infections, we could switch her to LoEstrin - would be less likely to have breakthrough  bleeding now that she has been on OCPs for a while.   H/o ASCUS pap without HR HPV - Recheck cytology in 1 yr - June 2014 - per ASCCP guidelines she she tested neg for HR HPV we could actually Orhan Mayorga to routine screening which would be to repeat pap in 3 yrs.  No orders of the defined types were placed in this encounter.   Norberto Sorenson, MD MPH

## 2012-06-07 LAB — HEPATIC FUNCTION PANEL
ALT: 12 U/L (ref 0–35)
AST: 16 U/L (ref 0–37)
Alkaline Phosphatase: 65 U/L (ref 39–117)
Indirect Bilirubin: 0.2 mg/dL (ref 0.0–0.9)
Total Protein: 6.5 g/dL (ref 6.0–8.3)

## 2012-06-08 LAB — URINE CULTURE: Colony Count: NO GROWTH

## 2012-06-17 ENCOUNTER — Encounter: Payer: Self-pay | Admitting: Family Medicine

## 2012-06-25 ENCOUNTER — Ambulatory Visit (INDEPENDENT_AMBULATORY_CARE_PROVIDER_SITE_OTHER): Payer: BC Managed Care – PPO | Admitting: Family Medicine

## 2012-06-25 ENCOUNTER — Telehealth: Payer: Self-pay

## 2012-06-25 VITALS — BP 96/64 | HR 92 | Temp 98.2°F | Resp 16 | Ht 62.75 in | Wt 126.4 lb

## 2012-06-25 DIAGNOSIS — N898 Other specified noninflammatory disorders of vagina: Secondary | ICD-10-CM

## 2012-06-25 DIAGNOSIS — Z309 Encounter for contraceptive management, unspecified: Secondary | ICD-10-CM

## 2012-06-25 DIAGNOSIS — B373 Candidiasis of vulva and vagina: Secondary | ICD-10-CM

## 2012-06-25 LAB — POCT URINALYSIS DIPSTICK
Blood, UA: NEGATIVE
Nitrite, UA: NEGATIVE
Protein, UA: NEGATIVE
Spec Grav, UA: <=1.005
Urobilinogen, UA: 0.2
pH, UA: 5.5

## 2012-06-25 LAB — POCT UA - MICROSCOPIC ONLY
Bacteria, U Microscopic: NEGATIVE
Casts, Ur, LPF, POC: NEGATIVE
Crystals, Ur, HPF, POC: NEGATIVE
Mucus, UA: NEGATIVE
Yeast, UA: NEGATIVE

## 2012-06-25 LAB — POCT WET PREP WITH KOH: WBC Wet Prep HPF POC: NEGATIVE

## 2012-06-25 MED ORDER — NORETHIN ACE-ETH ESTRAD-FE 1.5-30 MG-MCG PO TABS: 1.0000 | ORAL_TABLET | Freq: Every day | ORAL | Status: DC

## 2012-06-25 NOTE — Telephone Encounter (Signed)
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 She has been taking Diflucan weekly since January and wonders if she should increase her dosage this week, or can she take it and use OTC creams?  Ayah [phone]

## 2012-06-25 NOTE — Telephone Encounter (Signed)
If she is not improved, she should come in to office for recheck needs wet prep called her to advise. She states she has no concerns about STD's. She will come in today.

## 2012-06-25 NOTE — Progress Notes (Signed)
[address]   [place], [place]  [address]   [phone]  Subjective:    Patient ID: Pamela Carr, female    DOB: [birth year], 22 y.o.   MRN: [id number]  HPI This 22 y.o. female presents for evaluation of recurrent vaginal discharge.  Recurrent candidiasis for 1.5 years.  Yeast infection free for three months on weekly Diflucan.  Major stressors lately.  Got BV last week; finished Flagyl this morning.  Noticed three days ago had a lot of burning.  Used boric acid suppository without improvement.  Past two days, dysuria at vaginal opening.  Three hours ago, had a ton of white thick discharge.  Only one partner for six years; STD screening in past year so not worried.  BV x 3 this year; only three episodes ever.  Soap is trigger for yeast; only using hot water.  Thinks stress is trigger.  LMP two weeks ago;normal.  Trispintec for five years; considering switching to Loestrin if recurrent yeast infection.  Previously spotted on Loestrin for one month.  No hematuria, frequency, nocturia.  Recurrent yeast infections started in 01/2011 after wisdom teeth extraction with PCN for ten days.  With BV, has LLQ pain, diarrhea; none of these symptoms currently.  Tomorrow is day to take Diflucan weekly.  Has been on weekly Diflucan 150mg  po since 03/2012.   Review of Systems  Constitutional: Negative for fever, chills, diaphoresis and fatigue.  Gastrointestinal: Negative for vomiting, abdominal pain, diarrhea and constipation.  Genitourinary: Positive for dysuria and vaginal discharge. Negative for urgency, frequency, hematuria, flank pain, vaginal bleeding, difficulty urinating, genital sores, vaginal pain, menstrual problem, pelvic pain and dyspareunia.  Skin: Negative for rash.        Past Medical History  Diagnosis Date  . Vaginal yeast infection   . UTI (urinary tract infection)     Past Surgical History  Procedure Laterality Date  . Fracture surgery    . Wisdom teeth extraction      Prior to  Admission medications   Medication Sig Start Date End Date Taking? Authorizing Provider  AMBULATORY NON FORMULARY MEDICATION Medication Name: Boric Acid Capsules 600mg   Insert 1 capsule into the vagina twice each week. 11/20/11  Yes Kirkland Hun, MD  clobetasol cream (TEMOVATE) 0.05 % Apply topically 2 (two) times daily as needed. 07/30/11 07/29/12 Yes Kirkland Hun, MD  clonazePAM (KLONOPIN) 0.5 MG tablet Take 0.5 tablets (0.25 mg total) by mouth 2 (two) times daily as needed for anxiety. 03/12/12  Yes Sherren Mocha, MD  estradiol (ESTRACE) 0.1 MG/GM vaginal cream Place 2 g vaginally daily as needed.   Yes Historical Provider, MD  fluconazole (DIFLUCAN) 150 MG tablet Take 1 tablet (150 mg total) by mouth once a week. 04/26/12  Yes Sherren Mocha, MD  metroNIDAZOLE (FLAGYL) 500 MG tablet Take 1 tablet (500 mg total) by mouth 2 (two) times daily with a meal. DO NOT CONSUME ALCOHOL WHILE TAKING THIS MEDICATION. 04/30/12  Yes Sherren Mocha, MD  miconazole (MICONAZOLE 3) 200 MG vaginal suppository Place 1 suppository (200 mg total) vaginally at bedtime. 04/12/12  Yes Sherren Mocha, MD  Multiple Vitamin (MULTIVITAMIN) tablet Take 1 tablet by mouth daily.   Yes Historical Provider, MD  OVER THE COUNTER MEDICATION Cranberry supplement   Yes Historical Provider, MD  OVER THE COUNTER MEDICATION Probiotic taking daily   Yes Historical Provider, MD  ranitidine (ZANTAC) 150 MG tablet Take 150 mg by mouth as needed. 03/12/12  Yes Sherren Mocha, MD  TRI-PREVIFEM 0.18/0.215/0.25 MG-35 MCG tablet  02/05/12  Yes Historical Provider, MD    No Known Allergies  History   Social History  . Marital Status: Single    Spouse Name: N/A    Number of Children: N/A  . Years of Education: N/A   Occupational History  . Not on file.   Social History Main Topics  . Smoking status: Never Smoker   . Smokeless tobacco: Never Used  . Alcohol Use: No     Comment: 2 x per month  . Drug Use: No  . Sexually Active: Yes    Birth Control/  Protection: Pill   Other Topics Concern  . Not on file   Social History Narrative  . No narrative on file    Family History  Problem Relation Age of Onset  . Allergies Father   . Hypertension Maternal Grandmother   . Asthma Maternal Grandfather   . Allergies Maternal Grandfather   . Arthritis Paternal Grandmother     Objective:   Physical Exam  Nursing note and vitals reviewed. Constitutional: She is oriented to person, place, and time. She appears well-developed and well-nourished. No distress.  Abdominal: Soft. Bowel sounds are normal. She exhibits no distension and no mass. There is no tenderness. There is no rebound and no guarding.  Genitourinary: Uterus normal. Cervix exhibits discharge. Cervix exhibits no motion tenderness and no friability. Right adnexum displays no mass, no tenderness and no fullness. Left adnexum displays no mass, no tenderness and no fullness. No erythema, tenderness or bleeding around the vagina. No foreign body around the vagina. Vaginal discharge found.  Scant white discharge vaginal vault.  Neurological: She is alert and oriented to person, place, and time.  Skin: She is not diaphoretic.  Psychiatric: She has a normal mood and affect. Her behavior is normal.    Results for orders placed in visit on 06/25/12  POCT UA - MICROSCOPIC ONLY      Result Value Range   WBC, Ur, HPF, POC NEG     RBC, urine, microscopic 0-1     Bacteria, U Microscopic NEG     Mucus, UA NEG     Epithelial cells, urine per micros 0-2     Crystals, Ur, HPF, POC NEG     Casts, Ur, LPF, POC NEG     Yeast, UA NEG    POCT URINALYSIS DIPSTICK      Result Value Range   Color, UA YELLOW     Clarity, UA CLEAR     Glucose, UA NEG     Bilirubin, UA NEG     Ketones, UA NEG     Spec Grav, UA <=1.005     Blood, UA NEG     pH, UA 5.5     Protein, UA NEG     Urobilinogen, UA 0.2     Nitrite, UA NEG     Leukocytes, UA Negative    POCT WET PREP WITH KOH      Result Value Range    Trichomonas, UA Negative     Clue Cells Wet Prep HPF POC NEG     Epithelial Wet Prep HPF POC 1-3     Yeast Wet Prep HPF POC NEG     Bacteria Wet Prep HPF POC TRACE     RBC Wet Prep HPF POC 0-1     WBC Wet Prep HPF POC NEG     KOH Prep POC Negative  Assessment & Plan:  Vaginal discharge - Plan: GC/Chlamydia Probe Amp, POCT UA - Microscopic Only, POCT urinalysis dipstick, POCT Wet Prep with KOH  Contraception management - Plan: norethindrone-ethinyl estradiol-iron (MICROGESTIN FE,GILDESS FE,LOESTRIN FE) 1.5-30 MG-MCG tablet  Candidiasis of vulva and vagina   1.  Vaginal discharge:  New/recurrent.  Negative wet prep.  Discharge most consistent with candidiasis. Recommend taking Diflucan 150mg  po today; may repeat in 72 hours if persistent; then should resume weekly Diflucan. 2.  Contraception Management: stable; will change to Loestrin 1.5/30 daily to decrease hormonal dose; high OCP dose may be contributing to recurrent candidiasis. 3.  Recurrent Candidiasis: improved with weekly Diflucan.  Has failed boric acid suppositories. Will decrease to lower dose OCP.  Meds ordered this encounter  Medications  . norethindrone-ethinyl estradiol-iron (MICROGESTIN FE,GILDESS FE,LOESTRIN FE) 1.5-30 MG-MCG tablet    Sig: Take 1 tablet by mouth daily.    Dispense:  1 Package    Refill:  11

## 2012-06-25 NOTE — Patient Instructions (Addendum)
Vaginal discharge - Plan: GC/Chlamydia Probe Amp, POCT UA - Microscopic Only, POCT urinalysis dipstick, POCT Wet Prep with KOH  Contraception management - Plan: norethindrone-ethinyl estradiol-iron (MICROGESTIN FE,GILDESS FE,LOESTRIN FE) 1.5-30 MG-MCG tablet

## 2012-06-27 LAB — GC/CHLAMYDIA PROBE AMP
CT Probe RNA: NEGATIVE
GC Probe RNA: NEGATIVE

## 2012-06-28 ENCOUNTER — Ambulatory Visit (INDEPENDENT_AMBULATORY_CARE_PROVIDER_SITE_OTHER): Payer: BC Managed Care – PPO | Admitting: Family Medicine

## 2012-06-28 VITALS — BP 96/56 | HR 91 | Temp 97.6°F | Resp 16 | Ht 63.0 in | Wt 124.0 lb

## 2012-06-28 DIAGNOSIS — N760 Acute vaginitis: Secondary | ICD-10-CM

## 2012-06-28 DIAGNOSIS — B9689 Other specified bacterial agents as the cause of diseases classified elsewhere: Secondary | ICD-10-CM

## 2012-06-28 DIAGNOSIS — N898 Other specified noninflammatory disorders of vagina: Secondary | ICD-10-CM

## 2012-06-28 LAB — POCT UA - MICROSCOPIC ONLY: Crystals, Ur, HPF, POC: NEGATIVE

## 2012-06-28 LAB — POCT URINALYSIS DIPSTICK
Leukocytes, UA: NEGATIVE
Protein, UA: NEGATIVE
Urobilinogen, UA: 0.2
pH, UA: 6

## 2012-06-28 LAB — POCT WET PREP WITH KOH

## 2012-06-28 MED ORDER — METRONIDAZOLE 0.75 % VA GEL: 1.0000 | Freq: Every day | VAGINAL | Status: DC

## 2012-06-28 NOTE — Progress Notes (Signed)
Subjective:    Patient ID: Pamela Carr, female    DOB: 1990/11/13, 22 y.o.   MRN: [id number]  HPI  Last wk she developed BV. She was visiting her parents so was seen at an UC there.  As soon as she finished the flagyl, she developed burning after urination and a small amount of discharge so came in 3d ago to see Dr. Katrinka Blazing.  Her wet prep/KOH was negative here but Dr. Katrinka Blazing did go ahead and switch her to LoEstrin as Evett and I had discussed in the prior visit.  she will switch next mo. Since then the discharge and burning for 5 min after urination has progressively worsened.   She knows she has BV again as she has been under a ton of stress which may be triggering it. She and [name] - her boyfriend of 6 yrs and they are living together have been having more and more fights over the past 2 wks and are considering breaking up. She knows that this might happen and in the long run be ok but is understandably very distraught.  Hoping they will decide tonight whether they are going to stay together. He has been accusing her of cheating, which she is definitely not.  Past Medical History  Diagnosis Date  . Vaginal yeast infection   . UTI (urinary tract infection)    Current Outpatient Prescriptions on File Prior to Visit  Medication Sig Dispense Refill  . AMBULATORY NON FORMULARY MEDICATION Medication Name: Boric Acid Capsules 600mg   Insert 1 capsule into the vagina twice each week.  24 capsule  12  . clobetasol cream (TEMOVATE) 0.05 % Apply topically 2 (two) times daily as needed.      . clonazePAM (KLONOPIN) 0.5 MG tablet Take 0.5 tablets (0.25 mg total) by mouth 2 (two) times daily as needed for anxiety.  20 tablet  1  . estradiol (ESTRACE) 0.1 MG/GM vaginal cream Place 2 g vaginally daily as needed.      . fluconazole (DIFLUCAN) 150 MG tablet Take 1 tablet (150 mg total) by mouth once a week.  12 tablet  1  . miconazole (MICONAZOLE 3) 200 MG vaginal suppository Place 1 suppository (200 mg total)  vaginally at bedtime.  3 suppository  1  . Multiple Vitamin (MULTIVITAMIN) tablet Take 1 tablet by mouth daily.      . norethindrone-ethinyl estradiol-iron (MICROGESTIN FE,GILDESS FE,LOESTRIN FE) 1.5-30 MG-MCG tablet Take 1 tablet by mouth daily.  1 Package  11  . OVER THE COUNTER MEDICATION Cranberry supplement      . OVER THE COUNTER MEDICATION Probiotic taking daily      . ranitidine (ZANTAC) 150 MG tablet Take 150 mg by mouth as needed.       No current facility-administered medications on file prior to visit.   No Known Allergies     Review of Systems  Constitutional: Negative for fever, chills, diaphoresis, activity change, appetite change, fatigue and unexpected weight change.  Gastrointestinal: Negative for abdominal pain, diarrhea, constipation, blood in stool, anal bleeding and rectal pain.  Genitourinary: Positive for dysuria, vaginal discharge, genital sores and vaginal pain. Negative for urgency, frequency, hematuria, flank pain, decreased urine volume, vaginal bleeding, enuresis, difficulty urinating, menstrual problem, pelvic pain and dyspareunia.       Anal itching  Musculoskeletal: Negative for gait problem.  Skin: Negative for rash.  Hematological: Negative for adenopathy.  Psychiatric/Behavioral: Positive for dysphoric mood. Negative for suicidal ideas, sleep disturbance and self-injury. The patient is not nervous/anxious.  BP 96/56  Pulse 91  Temp(Src) 97.6 F (36.4 C) (Oral)  Resp 16  Ht 5\' 3"  (1.6 m)  Wt 124 lb (56.246 kg)  BMI 21.97 kg/m2  SpO2 99%  LMP 06/09/2012 Objective:   Physical Exam  Constitutional: She is oriented to person, place, and time. She appears well-developed and well-nourished. No distress.  HENT:  Head: Normocephalic and atraumatic. No trismus in the jaw.  Nose: Mucosal edema and rhinorrhea present.  Mouth/Throat: Mucous membranes are normal. No edematous. Posterior oropharyngeal erythema present. No oropharyngeal exudate or  posterior oropharyngeal edema.  Pulmonary/Chest: Effort normal.  Genitourinary: Uterus normal. Pelvic exam was performed with patient supine. There is rash and tenderness on the right labia. There is no lesion on the right labia. There is no rash, tenderness or lesion on the left labia. Cervix exhibits no motion tenderness and no friability. Right adnexum displays no mass, no tenderness and no fullness. Left adnexum displays no mass, no tenderness and no fullness. No erythema or tenderness around the vagina. Vaginal discharge found.  Right inner labia minora with mild erythema Moderate amount of white mucousy vag discharge on exam  Lymphadenopathy:       Right: No inguinal adenopathy present.       Left: No inguinal adenopathy present.  Neurological: She is alert and oriented to person, place, and time.  Skin: Skin is warm and dry. She is not diaphoretic.  Psychiatric: She has a normal mood and affect. Her speech is normal and behavior is normal. Judgment and thought content normal.  Tearful when discussing relationship          Results for orders placed in visit on 06/28/12  POCT URINALYSIS DIPSTICK      Result Value Range   Color, UA yellow     Clarity, UA clear     Glucose, UA neg     Bilirubin, UA neg     Ketones, UA neg     Spec Grav, UA 1.020     Blood, UA neg     pH, UA 6.0     Protein, UA neg     Urobilinogen, UA 0.2     Nitrite, UA neg     Leukocytes, UA Negative    POCT UA - MICROSCOPIC ONLY      Result Value Range   WBC, Ur, HPF, POC 0-1     RBC, urine, microscopic neg     Bacteria, U Microscopic 2+     Mucus, UA trace     Epithelial cells, urine per micros 3-14     Crystals, Ur, HPF, POC neg     Casts, Ur, LPF, POC neg     Yeast, UA neg    POCT WET PREP WITH KOH      Result Value Range   Trichomonas, UA Negative     Clue Cells Wet Prep HPF POC 2-12     Epithelial Wet Prep HPF POC 7-24 clusters     Yeast Wet Prep HPF POC neg     Bacteria Wet Prep HPF POC 4+      RBC Wet Prep HPF POC 0-4     WBC Wet Prep HPF POC 3-8     KOH Prep POC Negative      Assessment & Plan:  Vaginal discharge - Plan: POCT urinalysis dipstick, POCT UA - Microscopic Only, POCT Wet Prep with KOH, Urine culture, CANCELED: GC/chlamydia probe amp, urine  Bacterial vaginosis  Meds ordered this encounter  Medications  .  metroNIDAZOLE (METROGEL VAGINAL) 0.75 % vaginal gel    Sig: Place 1 Applicatorful vaginally at bedtime. For 5 days    Dispense:  70 g    Refill:  0

## 2012-06-28 NOTE — Patient Instructions (Addendum)
Consider trying Garnet Koyanagi at [facility] [phone] Dr. Barbie Haggis at [facility] [phone]  Or [phone] Hurley Cisco at [facility] [phone] Lysle Pearl at [facility] [phone]

## 2012-06-29 LAB — URINE CULTURE: Colony Count: NO GROWTH

## 2012-07-05 ENCOUNTER — Ambulatory Visit (INDEPENDENT_AMBULATORY_CARE_PROVIDER_SITE_OTHER): Payer: BC Managed Care – PPO | Admitting: Family Medicine

## 2012-07-05 VITALS — BP 114/72 | HR 74 | Temp 99.2°F | Resp 17 | Ht 63.5 in | Wt 123.0 lb

## 2012-07-05 DIAGNOSIS — J309 Allergic rhinitis, unspecified: Secondary | ICD-10-CM

## 2012-07-05 DIAGNOSIS — R6883 Chills (without fever): Secondary | ICD-10-CM

## 2012-07-05 DIAGNOSIS — N898 Other specified noninflammatory disorders of vagina: Secondary | ICD-10-CM

## 2012-07-05 LAB — POCT CBC
Granulocyte percent: 41.4 %G (ref 37–80)
HCT, POC: 43.3 % (ref 37.7–47.9)
Hemoglobin: 13.6 g/dL (ref 12.2–16.2)
MCHC: 31.4 g/dL — AB (ref 31.8–35.4)
MPV: 8.4 fL (ref 0–99.8)
POC Granulocyte: 2.9 (ref 2–6.9)
POC MID %: 9.3 %M (ref 0–12)
RBC: 4.74 M/uL (ref 4.04–5.48)

## 2012-07-05 LAB — POCT UA - MICROSCOPIC ONLY
Bacteria, U Microscopic: NEGATIVE
Crystals, Ur, HPF, POC: NEGATIVE
Mucus, UA: POSITIVE

## 2012-07-05 LAB — POCT WET PREP WITH KOH
KOH Prep POC: NEGATIVE
Trichomonas, UA: NEGATIVE
Yeast Wet Prep HPF POC: NEGATIVE

## 2012-07-05 LAB — POCT URINALYSIS DIPSTICK
Bilirubin, UA: NEGATIVE
Glucose, UA: NEGATIVE
Nitrite, UA: NEGATIVE

## 2012-07-05 MED ORDER — CETIRIZINE HCL 10 MG PO TABS: 10.0000 mg | ORAL_TABLET | Freq: Every day | ORAL | Status: DC

## 2012-07-05 NOTE — Progress Notes (Signed)
Subjective:    Patient ID: Pamela Carr, female    DOB: 10-06-1990, 22 y.o.   MRN: [id number] Chief Complaint  Patient presents with  . recurrent BV   HPI [name] broke up w/ her long-term partner Pamela Carr several days ago.  She started the metrogel last Wed and she is still having vaginal burning - She finished it 2d ago and yesterday she developed some chills and feeling more sluggish.  Yesterday, she passed a clump of discharge - it was thick and white. Did take her weekly diflucan on Sat.   Is having some sinus pressure and a little dizzy.  Past Medical History  Diagnosis Date  . Vaginal yeast infection   . UTI (urinary tract infection)    Current Outpatient Prescriptions on File Prior to Visit  Medication Sig Dispense Refill  . AMBULATORY NON FORMULARY MEDICATION Medication Name: Boric Acid Capsules 600mg   Insert 1 capsule into the vagina twice each week.  24 capsule  12  . clobetasol cream (TEMOVATE) 0.05 % Apply topically 2 (two) times daily as needed.      . clonazePAM (KLONOPIN) 0.5 MG tablet Take 0.5 tablets (0.25 mg total) by mouth 2 (two) times daily as needed for anxiety.  20 tablet  1  . estradiol (ESTRACE) 0.1 MG/GM vaginal cream Place 2 g vaginally daily as needed.      . fluconazole (DIFLUCAN) 150 MG tablet Take 1 tablet (150 mg total) by mouth once a week.  12 tablet  1  . metroNIDAZOLE (METROGEL VAGINAL) 0.75 % vaginal gel Place 1 Applicatorful vaginally at bedtime. For 5 days  70 g  0  . miconazole (MICONAZOLE 3) 200 MG vaginal suppository Place 1 suppository (200 mg total) vaginally at bedtime.  3 suppository  1  . Multiple Vitamin (MULTIVITAMIN) tablet Take 1 tablet by mouth daily.      . norethindrone-ethinyl estradiol-iron (MICROGESTIN FE,GILDESS FE,LOESTRIN FE) 1.5-30 MG-MCG tablet Take 1 tablet by mouth daily.  1 Package  11  . OVER THE COUNTER MEDICATION Cranberry supplement      . OVER THE COUNTER MEDICATION Probiotic taking daily      . ranitidine (ZANTAC) 150  MG tablet Take 150 mg by mouth as needed.       No current facility-administered medications on file prior to visit.   No Known Allergies   Review of Systems  Constitutional: Positive for chills. Negative for fever, diaphoresis, activity change, appetite change, fatigue and unexpected weight change.  HENT: Positive for congestion and sinus pressure.   Gastrointestinal: Negative for abdominal pain, diarrhea, constipation, blood in stool, anal bleeding and rectal pain.  Genitourinary: Positive for dysuria, vaginal discharge and vaginal pain. Negative for urgency, frequency, hematuria, flank pain, decreased urine volume, vaginal bleeding, enuresis, difficulty urinating, genital sores, menstrual problem, pelvic pain and dyspareunia.  Musculoskeletal: Negative for gait problem.  Skin: Negative for rash.  Hematological: Negative for adenopathy.  Psychiatric/Behavioral: The patient is not nervous/anxious.       BP 114/72  Pulse 74  Temp(Src) 99.2 F (37.3 C) (Oral)  Resp 17  Ht 5' 3.5" (1.613 m)  Wt 123 lb (55.792 kg)  BMI 21.44 kg/m2  SpO2 100%  LMP 06/09/2012 Objective:   Physical Exam  Constitutional: She is oriented to person, place, and time. She appears well-developed and well-nourished. No distress.  HENT:  Head: Normocephalic and atraumatic. No trismus in the jaw.  Right Ear: External ear and ear canal normal. A middle ear effusion is present.  Left Ear:  Tympanic membrane, external ear and ear canal normal.  Nose: Mucosal edema and rhinorrhea present.  Mouth/Throat: Uvula is midline and mucous membranes are normal. No edematous. Posterior oropharyngeal edema present. No oropharyngeal exudate, posterior oropharyngeal erythema or tonsillar abscesses.  Worse on left, 1+ tonsil on left  Eyes: Conjunctivae are normal. No scleral icterus.  Neck: Normal range of motion. Neck supple. No thyromegaly present.  Cardiovascular: Normal rate, regular rhythm, normal heart sounds and intact  distal pulses.   Pulmonary/Chest: Effort normal and breath sounds normal. No respiratory distress.  Genitourinary: Uterus normal. There is no rash, tenderness, lesion or injury on the right labia. There is no rash, tenderness, lesion or injury on the left labia. Cervix exhibits discharge and friability. Cervix exhibits no motion tenderness. Right adnexum displays no mass, no tenderness and no fullness. Left adnexum displays no mass, no tenderness and no fullness. There is tenderness around the vagina. No erythema or bleeding around the vagina. Vaginal discharge found.  Right tenderness on speculum exam. Copious yellow mucous discharge  Musculoskeletal: She exhibits no edema.  Lymphadenopathy:       Head (right side): No submandibular and no tonsillar adenopathy present.       Head (left side): No submandibular and no tonsillar adenopathy present.    She has no cervical adenopathy.       Right: No inguinal adenopathy present.       Left: No inguinal adenopathy present.  Neurological: She is alert and oriented to person, place, and time.  Skin: Skin is warm and dry. She is not diaphoretic. No erythema.  Psychiatric: She has a normal mood and affect. Her behavior is normal.      Results for orders placed in visit on 07/05/12  POCT CBC      Result Value Range   WBC 6.9  4.6 - 10.2 K/uL   Lymph, poc 3.4  0.6 - 3.4   POC LYMPH PERCENT 49.3  10 - 50 %L   MID (cbc) 0.6  0 - 0.9   POC MID % 9.3  0 - 12 %M   POC Granulocyte 2.9  2 - 6.9   Granulocyte percent 41.4  37 - 80 %G   RBC 4.74  4.04 - 5.48 M/uL   Hemoglobin 13.6  12.2 - 16.2 g/dL   HCT, POC 16.1  09.6 - 47.9 %   MCV 91.3  80 - 97 fL   MCH, POC 28.7  27 - 31.2 pg   MCHC 31.4 (*) 31.8 - 35.4 g/dL   RDW, POC 04.5     Platelet Count, POC 313  142 - 424 K/uL   MPV 8.4  0 - 99.8 fL  POCT WET PREP WITH KOH      Result Value Range   Trichomonas, UA Negative     Clue Cells Wet Prep HPF POC 0-1     Epithelial Wet Prep HPF POC 8-17      Yeast Wet Prep HPF POC neg     Bacteria Wet Prep HPF POC 2+     RBC Wet Prep HPF POC 0-1     WBC Wet Prep HPF POC 0-2     KOH Prep POC Negative    POCT URINALYSIS DIPSTICK      Result Value Range   Color, UA yellow     Clarity, UA clear     Glucose, UA neg     Bilirubin, UA neg     Ketones, UA trace  Spec Grav, UA 1.025     Blood, UA trace     pH, UA 5.5     Protein, UA neg     Urobilinogen, UA 0.2     Nitrite, UA neg     Leukocytes, UA Negative    POCT UA - MICROSCOPIC ONLY      Result Value Range   WBC, Ur, HPF, POC 0-1     RBC, urine, microscopic 0-2     Bacteria, U Microscopic neg     Mucus, UA positive     Epithelial cells, urine per micros neg     Crystals, Ur, HPF, POC neg     Casts, Ur, LPF, POC neg     Yeast, UA neg      Assessment & Plan:  Vaginal discharge - Plan: POCT CBC, POCT Wet Prep with KOH, POCT urinalysis dipstick, POCT UA - Microscopic Only - i suspect this is just a local reaction to the metronidazole. Will hopefully go away with time. Continue topical estrogen x 1 wk and rtc if worsening.  Chills - Plan: POCT CBC  Allergic rhinitis - start zyrtec, steam, netti pot  Meds ordered this encounter  Medications  . cetirizine (ZYRTEC) 10 MG tablet    Sig: Take 1 tablet (10 mg total) by mouth daily.    Dispense:  30 tablet    Refill:  5

## 2012-07-10 ENCOUNTER — Encounter: Payer: Self-pay | Admitting: Family Medicine

## 2012-07-15 ENCOUNTER — Encounter: Payer: Self-pay | Admitting: Family Medicine

## 2012-07-15 ENCOUNTER — Ambulatory Visit (INDEPENDENT_AMBULATORY_CARE_PROVIDER_SITE_OTHER): Payer: BC Managed Care – PPO | Admitting: Family Medicine

## 2012-07-15 VITALS — BP 100/58 | HR 94 | Temp 99.1°F | Resp 16 | Ht 63.0 in | Wt 121.0 lb

## 2012-07-15 DIAGNOSIS — N898 Other specified noninflammatory disorders of vagina: Secondary | ICD-10-CM

## 2012-07-15 DIAGNOSIS — N760 Acute vaginitis: Secondary | ICD-10-CM

## 2012-07-15 DIAGNOSIS — R300 Dysuria: Secondary | ICD-10-CM

## 2012-07-15 DIAGNOSIS — B9689 Other specified bacterial agents as the cause of diseases classified elsewhere: Secondary | ICD-10-CM

## 2012-07-15 LAB — POCT URINALYSIS DIPSTICK
Glucose, UA: NEGATIVE
Protein, UA: NEGATIVE
Spec Grav, UA: >=1.03

## 2012-07-15 LAB — POCT WET PREP WITH KOH
KOH Prep POC: NEGATIVE
RBC Wet Prep HPF POC: NEGATIVE
Yeast Wet Prep HPF POC: NEGATIVE

## 2012-07-15 LAB — POCT UA - MICROSCOPIC ONLY: Mucus, UA: POSITIVE

## 2012-07-15 MED ORDER — METRONIDAZOLE 500 MG PO TABS: 500.0000 mg | ORAL_TABLET | Freq: Two times a day (BID) | ORAL | Status: DC

## 2012-07-15 NOTE — Progress Notes (Signed)
Subjective:    Patient ID: Pamela Carr, female    DOB: May 26, 1990, 22 y.o.   MRN: [id number]  HPI  Has started having a little bit of burning when she urinates again over the past few days and she is having a significant more white vaginal discharge.  She is leaving this weekend on several family/friend excursions. She is having less right labial irritation.  Mentally, Pamela Carr is doing well.  She is living with a friend roommate and got a new tattoo yesterday.  Past Medical History  Diagnosis Date  . Vaginal yeast infection   . UTI (urinary tract infection)    Current Outpatient Prescriptions on File Prior to Visit  Medication Sig Dispense Refill  . AMBULATORY NON FORMULARY MEDICATION Medication Name: Boric Acid Capsules 600mg   Insert 1 capsule into the vagina twice each week.  24 capsule  12  . cetirizine (ZYRTEC) 10 MG tablet Take 1 tablet (10 mg total) by mouth daily.  30 tablet  5  . clobetasol cream (TEMOVATE) 0.05 % Apply topically 2 (two) times daily as needed.      . clonazePAM (KLONOPIN) 0.5 MG tablet Take 0.5 tablets (0.25 mg total) by mouth 2 (two) times daily as needed for anxiety.  20 tablet  1  . estradiol (ESTRACE) 0.1 MG/GM vaginal cream Place 2 g vaginally daily as needed.      . fluconazole (DIFLUCAN) 150 MG tablet Take 1 tablet (150 mg total) by mouth once a week.  12 tablet  1  . Multiple Vitamin (MULTIVITAMIN) tablet Take 1 tablet by mouth daily.      . norethindrone-ethinyl estradiol-iron (MICROGESTIN FE,GILDESS FE,LOESTRIN FE) 1.5-30 MG-MCG tablet Take 1 tablet by mouth daily.  1 Package  11  . OVER THE COUNTER MEDICATION Cranberry supplement      . OVER THE COUNTER MEDICATION Probiotic taking daily      . ranitidine (ZANTAC) 150 MG tablet Take 150 mg by mouth as needed.       No current facility-administered medications on file prior to visit.   No Known Allergies  Review of Systems  Constitutional: Negative for fever, chills, diaphoresis, activity change,  appetite change, fatigue and unexpected weight change.  Gastrointestinal: Negative for abdominal pain, diarrhea, constipation, blood in stool, anal bleeding and rectal pain.  Genitourinary: Positive for dysuria, vaginal discharge and vaginal pain. Negative for urgency, frequency, hematuria, decreased urine volume, vaginal bleeding, difficulty urinating, genital sores, menstrual problem, pelvic pain and dyspareunia.  Musculoskeletal: Negative for gait problem.  Skin: Negative for rash.  Hematological: Negative for adenopathy.  Psychiatric/Behavioral: The patient is not nervous/anxious.       BP 100/58  Pulse 94  Temp(Src) 99.1 F (37.3 C) (Oral)  Resp 16  Ht 5\' 3"  (1.6 m)  Wt 121 lb (54.885 kg)  BMI 21.44 kg/m2  SpO2 100%  LMP 07/07/2012 Objective:   Physical Exam  Constitutional: She is oriented to person, place, and time. She appears well-developed and well-nourished. No distress.  HENT:  Head: Normocephalic and atraumatic.  Pulmonary/Chest: Effort normal.  Abdominal: There is no hepatosplenomegaly. There is no CVA tenderness.  Genitourinary: No labial fusion. There is no rash, tenderness, lesion or injury on the right labia. There is no rash, tenderness, lesion or injury on the left labia. Cervix exhibits discharge. Cervix exhibits no motion tenderness and no friability. Right adnexum displays no mass, no tenderness and no fullness. Left adnexum displays no mass, no tenderness and no fullness. No erythema, tenderness or bleeding around the  vagina. No foreign body around the vagina. No signs of injury around the vagina. Vaginal discharge found.  Moderate amount of thick white discharge  Neurological: She is alert and oriented to person, place, and time.  Skin: Skin is warm and dry. She is not diaphoretic.  Psychiatric: She has a normal mood and affect. Her behavior is normal.      Results for orders placed in visit on 07/15/12  POCT URINALYSIS DIPSTICK      Result Value Range    Color, UA yellow     Clarity, UA clear     Glucose, UA neg     Bilirubin, UA neg     Ketones, UA trace     Spec Grav, UA >=1.030     Blood, UA trace     pH, UA 5.5     Protein, UA neg     Urobilinogen, UA 0.2     Nitrite, UA neg     Leukocytes, UA Negative    POCT UA - MICROSCOPIC ONLY      Result Value Range   WBC, Ur, HPF, POC 3-4     RBC, urine, microscopic 5-6     Bacteria, U Microscopic 1+     Mucus, UA pos     Epithelial cells, urine per micros 0-1     Crystals, Ur, HPF, POC neg     Casts, Ur, LPF, POC neg     Yeast, UA neg    POCT WET PREP WITH KOH      Result Value Range   Trichomonas, UA Negative     Clue Cells Wet Prep HPF POC tntc     Epithelial Wet Prep HPF POC neg     Yeast Wet Prep HPF POC neg     Bacteria Wet Prep HPF POC 3+     RBC Wet Prep HPF POC neg     WBC Wet Prep HPF POC 2-3     KOH Prep POC Negative      Assessment & Plan:  Vaginal discharge - Plan: POCT urinalysis dipstick, POCT UA - Microscopic Only, POCT Wet Prep with KOH  Bacterial vaginosis  Meds ordered this encounter  Medications  . metroNIDAZOLE (FLAGYL) 500 MG tablet    Sig: Take 1 tablet (500 mg total) by mouth 2 (two) times daily with a meal. DO NOT CONSUME ALCOHOL WHILE TAKING THIS MEDICATION.    Dispense:  14 tablet    Refill:  0   Start high dose probiotics next wk and cont for 3 wks.  F/u in 2-3 wks for recheck at [facility].

## 2012-07-26 ENCOUNTER — Ambulatory Visit (INDEPENDENT_AMBULATORY_CARE_PROVIDER_SITE_OTHER): Payer: BC Managed Care – PPO | Admitting: Family Medicine

## 2012-07-26 VITALS — BP 90/46 | HR 103 | Temp 98.1°F | Resp 16 | Ht 63.0 in | Wt 122.0 lb

## 2012-07-26 DIAGNOSIS — B9689 Other specified bacterial agents as the cause of diseases classified elsewhere: Secondary | ICD-10-CM

## 2012-07-26 DIAGNOSIS — N760 Acute vaginitis: Secondary | ICD-10-CM

## 2012-07-26 DIAGNOSIS — R1013 Epigastric pain: Secondary | ICD-10-CM

## 2012-07-26 LAB — POCT UA - MICROSCOPIC ONLY: Crystals, Ur, HPF, POC: NEGATIVE

## 2012-07-26 LAB — POCT URINALYSIS DIPSTICK
Bilirubin, UA: NEGATIVE
Glucose, UA: NEGATIVE
Ketones, UA: NEGATIVE
Spec Grav, UA: <=1.005
Urobilinogen, UA: 0.2

## 2012-07-26 LAB — POCT WET PREP WITH KOH
Clue Cells Wet Prep HPF POC: NEGATIVE
Trichomonas, UA: NEGATIVE
Yeast Wet Prep HPF POC: NEGATIVE

## 2012-07-26 NOTE — Progress Notes (Signed)
Subjective:    Patient ID: Pamela Carr, female    DOB: [birth year], 22 y.o.   MRN: [id number]  HPI  For the past 2d - finished flagyl Thursday - has had poor diet in the past several wks and not much space so not many fresh foods.  Not as regular with her probiotics but she has had a lot of gas and central cramping.  Has been having normal BM.  Has been nauseas intermittently.  Like heartburn and stool softer.  Took zantac yesterday.  Int cramping all day and is passing gas.    Review of Systems    BP 90/46  Pulse 103  Temp(Src) 98.1 F (36.7 C) (Oral)  Resp 16  Ht 5\' 3"  (1.6 m)  Wt 122 lb (55.339 kg)  BMI 21.62 kg/m2  SpO2 99%  LMP 07/07/2012 Objective:   Physical Exam  Constitutional: She is oriented to person, place, and time. She appears well-developed and well-nourished. No distress.  HENT:  Head: Normocephalic and atraumatic.  Neck: Normal range of motion. Neck supple. No thyromegaly present.  Cardiovascular: Normal rate, regular rhythm, normal heart sounds and intact distal pulses.   Pulmonary/Chest: Effort normal and breath sounds normal. No respiratory distress.  Abdominal: Soft. Bowel sounds are increased. There is no hepatosplenomegaly. There is no tenderness. There is CVA tenderness. There is no rebound and no guarding.  Genitourinary: There is no rash, tenderness or lesion on the right labia. There is no rash, tenderness or lesion on the left labia. Cervix exhibits no motion tenderness and no friability. No erythema, tenderness or bleeding around the vagina. No signs of injury around the vagina. Vaginal discharge found.  Minimal amount of thick white vaginal discharge. Cervical transformation zone with erythema.   Musculoskeletal: She exhibits no edema.  Lymphadenopathy:    She has no cervical adenopathy.  Neurological: She is alert and oriented to person, place, and time.  Skin: Skin is warm and dry. She is not diaphoretic. No erythema.  Psychiatric: She has a  normal mood and affect. Her behavior is normal.      Results for orders placed in visit on 07/26/12  POCT WET PREP WITH KOH      Result Value Range   Trichomonas, UA Negative     Clue Cells Wet Prep HPF POC neg     Epithelial Wet Prep HPF POC 2-8     Yeast Wet Prep HPF POC neg     Bacteria Wet Prep HPF POC 2 plus     RBC Wet Prep HPF POC 0-2     WBC Wet Prep HPF POC 6-12     KOH Prep POC Negative    POCT URINALYSIS DIPSTICK      Result Value Range   Color, UA yellow     Clarity, UA clear     Glucose, UA neg     Bilirubin, UA neg     Ketones, UA neg     Spec Grav, UA <=1.005     Blood, UA trace-intact     pH, UA 6.0     Protein, UA neg     Urobilinogen, UA 0.2     Nitrite, UA neg     Leukocytes, UA Negative    POCT UA - MICROSCOPIC ONLY      Result Value Range   WBC, Ur, HPF, POC 0-1     RBC, urine, microscopic 0-1     Bacteria, U Microscopic trace     Mucus, UA neg  Epithelial cells, urine per micros 1-3     Crystals, Ur, HPF, POC neg     Casts, Ur, LPF, POC neg     Yeast, UA neg      Assessment & Plan:  Vaginal discharge - normal. Plan: POCT Wet Prep with KOH, POCT urinalysis dipstick, POCT UA - Microscopic Only  Abdominal pain, epigastric - Plan: Urine culture - presumed due to gas. Rec yoga, exercise, fiber in diet.  See patient instructions.

## 2012-07-26 NOTE — Patient Instructions (Addendum)
Take the zantac twice a day for the next 3 days, then as needed.  Back off on the probiotics to the high dose vaginal flora 3 times a week and standard dose all other days.  Push fluids - WATER!!

## 2012-07-27 LAB — URINE CULTURE: Colony Count: NO GROWTH

## 2012-08-03 ENCOUNTER — Encounter: Payer: Self-pay | Admitting: Family Medicine

## 2012-08-04 ENCOUNTER — Other Ambulatory Visit: Payer: Self-pay | Admitting: Family Medicine

## 2012-08-04 DIAGNOSIS — B373 Candidiasis of vulva and vagina: Secondary | ICD-10-CM

## 2012-08-04 MED ORDER — FLUCONAZOLE 150 MG PO TABS: 150.0000 mg | ORAL_TABLET | ORAL | Status: DC

## 2012-08-05 ENCOUNTER — Other Ambulatory Visit: Payer: Self-pay | Admitting: Family Medicine

## 2012-08-05 ENCOUNTER — Ambulatory Visit (INDEPENDENT_AMBULATORY_CARE_PROVIDER_SITE_OTHER): Payer: BC Managed Care – PPO | Admitting: Family Medicine

## 2012-08-05 VITALS — BP 109/74 | HR 75 | Temp 97.5°F | Resp 16

## 2012-08-05 DIAGNOSIS — N912 Amenorrhea, unspecified: Secondary | ICD-10-CM

## 2012-08-05 DIAGNOSIS — R300 Dysuria: Secondary | ICD-10-CM

## 2012-08-05 LAB — POCT URINALYSIS DIPSTICK
Glucose, UA: NEGATIVE
Ketones, UA: NEGATIVE
Leukocytes, UA: NEGATIVE
Protein, UA: NEGATIVE
Urobilinogen, UA: 0.2

## 2012-08-05 LAB — POCT WET PREP WITH KOH
Trichomonas, UA: NEGATIVE
Yeast Wet Prep HPF POC: NEGATIVE

## 2012-08-05 LAB — RPR

## 2012-08-05 LAB — POCT UA - MICROSCOPIC ONLY
Mucus, UA: NEGATIVE
RBC, urine, microscopic: NEGATIVE
Yeast, UA: NEGATIVE

## 2012-08-05 LAB — HIV ANTIBODY (ROUTINE TESTING W REFLEX): HIV: NONREACTIVE

## 2012-08-05 NOTE — Progress Notes (Signed)
  Subjective:    Patient ID: Pamela Carr, female    DOB: 1990/11/23, 22 y.o.   MRN: [id number] Chief Complaint  Patient presents with  . Dysuria  . Amenorrhea    lighter period    HPI  Took a pregnancy test but worried as it was much shorter than normal Not causing irritation    Review of Systems     Objective:   Physical Exam        Results for orders placed in visit on 08/05/12  POCT URINE PREGNANCY      Result Value Range   Preg Test, Ur Negative    POCT URINALYSIS DIPSTICK      Result Value Range   Color, UA yellow     Clarity, UA cleasr     Glucose, UA neg     Bilirubin, UA neg     Ketones, UA neg     Spec Grav, UA 1.010     Blood, UA neg     pH, UA 6.0     Protein, UA neg     Urobilinogen, UA 0.2     Nitrite, UA neg     Leukocytes, UA Negative    POCT UA - MICROSCOPIC ONLY      Result Value Range   WBC, Ur, HPF, POC 1-4     RBC, urine, microscopic neg     Bacteria, U Microscopic neg     Mucus, UA neg     Epithelial cells, urine per micros 0-2     Crystals, Ur, HPF, POC neg     Casts, Ur, LPF, POC neg     Yeast, UA neg    POCT WET PREP WITH KOH      Result Value Range   Trichomonas, UA Negative     Clue Cells Wet Prep HPF POC neg     Epithelial Wet Prep HPF POC 3-8     Yeast Wet Prep HPF POC neg     Bacteria Wet Prep HPF POC trace     RBC Wet Prep HPF POC 20-30     WBC Wet Prep HPF POC 0-2     KOH Prep POC Negative      Assessment & Plan:

## 2012-08-06 ENCOUNTER — Encounter: Payer: Self-pay | Admitting: Family Medicine

## 2012-08-06 ENCOUNTER — Telehealth: Payer: Self-pay

## 2012-08-06 LAB — GC/CHLAMYDIA PROBE AMP: GC Probe RNA: NEGATIVE

## 2012-08-06 NOTE — Telephone Encounter (Signed)
Spoke with Jennette Kettle at [facility]. Per Dr. Clelia Croft, called and added on a Qualitative Serum HcG

## 2012-08-06 NOTE — Telephone Encounter (Signed)
I don't see where we did a blood hcg??

## 2012-08-11 ENCOUNTER — Ambulatory Visit (INDEPENDENT_AMBULATORY_CARE_PROVIDER_SITE_OTHER): Payer: BC Managed Care – PPO | Admitting: Family Medicine

## 2012-08-11 VITALS — BP 92/68 | HR 77 | Temp 98.5°F | Resp 18 | Wt 122.0 lb

## 2012-08-11 DIAGNOSIS — N762 Acute vulvitis: Secondary | ICD-10-CM

## 2012-08-11 DIAGNOSIS — J029 Acute pharyngitis, unspecified: Secondary | ICD-10-CM

## 2012-08-11 DIAGNOSIS — R300 Dysuria: Secondary | ICD-10-CM

## 2012-08-11 DIAGNOSIS — N760 Acute vaginitis: Secondary | ICD-10-CM

## 2012-08-11 DIAGNOSIS — B373 Candidiasis of vulva and vagina: Secondary | ICD-10-CM

## 2012-08-11 LAB — POCT WET PREP WITH KOH
KOH Prep POC: NEGATIVE
RBC Wet Prep HPF POC: NEGATIVE
Yeast Wet Prep HPF POC: NEGATIVE

## 2012-08-11 LAB — POCT URINALYSIS DIPSTICK
Blood, UA: NEGATIVE
Protein, UA: NEGATIVE
Spec Grav, UA: 1.015
Urobilinogen, UA: 0.2
pH, UA: 6

## 2012-08-11 LAB — POCT UA - MICROSCOPIC ONLY
Casts, Ur, LPF, POC: NEGATIVE
Crystals, Ur, HPF, POC: NEGATIVE
Mucus, UA: NEGATIVE

## 2012-08-11 NOTE — Progress Notes (Signed)
Subjective:    Patient ID: Pamela Carr, female    DOB: 1990/06/16, 22 y.o.   MRN: [id number] Chief Complaint  Patient presents with  . Follow-up    yeast infection/UTI  . Sore Throat    HPI  Recheck about HSV - the test was negative.  Her new boyfriend has not been sexually active since 05/2011 -  Does get occ tonsilith and has a few white spots on her right side.    Past Medical History  Diagnosis Date  . Vaginal yeast infection   . UTI (urinary tract infection)    Current Outpatient Prescriptions on File Prior to Visit  Medication Sig Dispense Refill  . cetirizine (ZYRTEC) 10 MG tablet Take 1 tablet (10 mg total) by mouth daily.  30 tablet  5  . Multiple Vitamin (MULTIVITAMIN) tablet Take 1 tablet by mouth daily.      . ranitidine (ZANTAC) 150 MG tablet Take 150 mg by mouth as needed.       No current facility-administered medications on file prior to visit.   No Known Allergies   Review of Systems    BP 92/68  Pulse 77  Resp 18  Wt 122 lb (55.339 kg)  BMI 21.62 kg/m2  LMP 08/01/2012 Objective:   Physical Exam        Results for orders placed in visit on 08/11/12  POCT UA - MICROSCOPIC ONLY      Result Value Range   WBC, Ur, HPF, POC 2-4     RBC, urine, microscopic 0-1     Bacteria, U Microscopic trace     Mucus, UA neg     Epithelial cells, urine per micros 3-6     Crystals, Ur, HPF, POC neg     Casts, Ur, LPF, POC neg     Yeast, UA neg    POCT URINALYSIS DIPSTICK      Result Value Range   Color, UA yellow     Clarity, UA clear     Glucose, UA neg     Bilirubin, UA neg     Ketones, UA trace     Spec Grav, UA 1.015     Blood, UA neg     pH, UA 6.0     Protein, UA neg     Urobilinogen, UA 0.2     Nitrite, UA neg     Leukocytes, UA Negative    POCT RAPID STREP A (OFFICE)      Result Value Range   Rapid Strep A Screen Negative  Negative  POCT WET PREP WITH KOH      Result Value Range   Trichomonas, UA Negative     Clue Cells Wet Prep HPF  POC 0-1     Epithelial Wet Prep HPF POC 3-6     Yeast Wet Prep HPF POC neg     Bacteria Wet Prep HPF POC 1+     RBC Wet Prep HPF POC neg     WBC Wet Prep HPF POC 10-15     KOH Prep POC Negative      Assessment & Plan:  Dysuria - Plan: POCT UA - Microscopic Only, POCT urinalysis dipstick  Vulvitis - Plan: POCT Wet Prep with KOH  Vaginal candidiasis  Acute pharyngitis - Plan: POCT rapid strep A - advised salt water gargles and hot tea w/ honey and lemon.  If sxs worsen with fevers, chills, severe fatigue, myalgias, severe sore throat - ok to call in antibiotic - amox -  for coverage but will try to hold off as do not want to trigger BV or vag candidiasis flair.

## 2012-08-12 ENCOUNTER — Encounter: Payer: Self-pay | Admitting: Family Medicine

## 2012-09-10 ENCOUNTER — Ambulatory Visit (INDEPENDENT_AMBULATORY_CARE_PROVIDER_SITE_OTHER): Payer: BC Managed Care – PPO | Admitting: Family Medicine

## 2012-09-10 ENCOUNTER — Encounter: Payer: Self-pay | Admitting: Family Medicine

## 2012-09-10 VITALS — BP 98/56 | HR 82 | Temp 97.7°F | Resp 16 | Ht 62.5 in | Wt 121.0 lb

## 2012-09-10 DIAGNOSIS — B373 Candidiasis of vulva and vagina: Secondary | ICD-10-CM

## 2012-09-10 DIAGNOSIS — N760 Acute vaginitis: Secondary | ICD-10-CM

## 2012-09-10 DIAGNOSIS — Z0000 Encounter for general adult medical examination without abnormal findings: Secondary | ICD-10-CM

## 2012-09-10 LAB — POCT UA - MICROSCOPIC ONLY
Bacteria, U Microscopic: NEGATIVE
WBC, Ur, HPF, POC: NEGATIVE
Yeast, UA: NEGATIVE

## 2012-09-10 LAB — POCT URINALYSIS DIPSTICK
Ketones, UA: NEGATIVE
Leukocytes, UA: NEGATIVE
Nitrite, UA: NEGATIVE
Protein, UA: NEGATIVE
Urobilinogen, UA: 0.2
pH, UA: 6.5

## 2012-09-10 LAB — POCT WET PREP WITH KOH
KOH Prep POC: POSITIVE
Yeast Wet Prep HPF POC: NEGATIVE

## 2012-09-10 MED ORDER — FLUCONAZOLE 150 MG PO TABS: 150.0000 mg | ORAL_TABLET | ORAL | Status: DC

## 2012-09-10 MED ORDER — METRONIDAZOLE 500 MG PO TABS: 500.0000 mg | ORAL_TABLET | Freq: Two times a day (BID) | ORAL | Status: DC

## 2012-09-10 NOTE — Patient Instructions (Addendum)

## 2012-09-10 NOTE — Progress Notes (Signed)
Subjective:    Patient ID: Pamela Carr, female    DOB: 11/16/1990, 22 y.o.   MRN: [id number] Chief Complaint  Patient presents with  . Annual Exam    w/pap    HPI  Pamela Carr is doing great. Her new relationship is much more supportive. She has felt some vaginal burning over the past several days but it has been mild compared to previous and other than that has been symptom free for a while. She has actually had much less discharge. She is still taking the once weekly diflucan. Her boyfriend and her ex-boyfriend both got tested for hsv and were negative so feeling confident that the results were in error. She has not needed to use the klonopin at all.  Pamela Carr has 3 thyroid cysts, 2 small and 1 larger and monitor w/ [place] yearly as prev neg biopsy and nml annual TFTs. She would like these repeated today w/ results sent to her endocrinologist in [facility].  Past Medical History  Diagnosis Date  . Vaginal yeast infection   . UTI (urinary tract infection)    Current Outpatient Prescriptions on File Prior to Visit  Medication Sig Dispense Refill  . AMBULATORY NON FORMULARY MEDICATION Medication Name: Boric Acid Capsules 600mg   Insert 1 capsule into the vagina twice each week.  24 capsule  12  . cetirizine (ZYRTEC) 10 MG tablet Take 1 tablet (10 mg total) by mouth daily.  30 tablet  5  . estradiol (ESTRACE) 0.1 MG/GM vaginal cream Place 2 g vaginally daily as needed.      . Multiple Vitamin (MULTIVITAMIN) tablet Take 1 tablet by mouth daily.      . norethindrone-ethinyl estradiol-iron (MICROGESTIN FE,GILDESS FE,LOESTRIN FE) 1.5-30 MG-MCG tablet Take 1 tablet by mouth daily.  1 Package  11  . ranitidine (ZANTAC) 150 MG tablet Take 150 mg by mouth as needed.      . clonazePAM (KLONOPIN) 0.5 MG tablet Take 0.5 tablets (0.25 mg total) by mouth 2 (two) times daily as needed for anxiety.  20 tablet  1   No current facility-administered medications on file prior to visit.   No Known Allergies Past Surgical  History  Procedure Laterality Date  . Fracture surgery    . Wisdom teeth extraction     Family History  Problem Relation Age of Onset  . Allergies Father   . Hypertension Maternal Grandmother   . Asthma Maternal Grandfather   . Allergies Maternal Grandfather   . Arthritis Paternal Grandmother     rheumatoid   History   Social History  . Marital Status: Single    Spouse Name: N/A    Number of Children: N/A  . Years of Education: N/A   Social History Main Topics  . Smoking status: Never Smoker   . Smokeless tobacco: Never Used  . Alcohol Use: No     Comment: 2 x per month  . Drug Use: No  . Sexually Active: Yes    Birth Control/ Protection: Pill   Other Topics Concern  . None   Social History Narrative   Significant other. Education: [employer]. Exercise: Cardio Strengths 3 times a week for 25 minutes.   Review of Systems  Constitutional: Negative.   HENT: Negative.   Eyes: Negative.   Respiratory: Negative.   Cardiovascular: Negative.   Gastrointestinal: Negative.   Endocrine: Negative.   Genitourinary: Negative.   Musculoskeletal: Negative.   Skin: Negative.   Allergic/Immunologic: Negative.   Neurological: Negative.   Hematological: Negative.   Psychiatric/Behavioral:  Negative.       BP 98/56  Pulse 82  Temp(Src) 97.7 F (36.5 C) (Oral)  Resp 16  Ht 5' 2.5" (1.588 m)  Wt 121 lb (54.885 kg)  BMI 21.76 kg/m2  SpO2 100%  LMP 08/29/2012 Objective:   Physical Exam  Constitutional: She is oriented to person, place, and time. She appears well-developed and well-nourished. No distress.  HENT:  Head: Normocephalic and atraumatic.  Right Ear: Tympanic membrane, external ear and ear canal normal.  Left Ear: Tympanic membrane, external ear and ear canal normal.  Nose: Mucosal edema present. No rhinorrhea.  Mouth/Throat: Uvula is midline, oropharynx is clear and moist and mucous membranes are normal. No posterior oropharyngeal erythema.  Eyes: Conjunctivae  and EOM are normal. Pupils are equal, round, and reactive to light. Right eye exhibits no discharge. Left eye exhibits no discharge. No scleral icterus.  Neck: Normal range of motion. Neck supple. No thyromegaly present.  Cardiovascular: Normal rate, regular rhythm, normal heart sounds and intact distal pulses.   Pulmonary/Chest: Effort normal and breath sounds normal. No respiratory distress.  Abdominal: Soft. Bowel sounds are normal. There is no tenderness.  Genitourinary: Vagina normal and uterus normal. No breast swelling, tenderness, discharge or bleeding. Cervix exhibits no motion tenderness and no friability. Right adnexum displays no mass and no tenderness. Left adnexum displays no mass and no tenderness.  Musculoskeletal: She exhibits no edema.  Lymphadenopathy:    She has no cervical adenopathy.  Neurological: She is alert and oriented to person, place, and time. She has normal reflexes.  Skin: Skin is warm and dry. She is not diaphoretic. No erythema.  Psychiatric: She has a normal mood and affect. Her behavior is normal.          Results for orders placed in visit on 09/10/12  POCT UA - MICROSCOPIC ONLY      Result Value Range   WBC, Ur, HPF, POC neg     RBC, urine, microscopic 0-2     Bacteria, U Microscopic neg     Mucus, UA moderate     Epithelial cells, urine per micros 1-12     Crystals, Ur, HPF, POC neg     Casts, Ur, LPF, POC neg     Yeast, UA neg    POCT URINALYSIS DIPSTICK      Result Value Range   Color, UA yellow     Clarity, UA clear     Glucose, UA neg     Bilirubin, UA neg     Ketones, UA neg     Spec Grav, UA 1.020     Blood, UA neg     pH, UA 6.5     Protein, UA neg     Urobilinogen, UA 0.2     Nitrite, UA neg     Leukocytes, UA Negative    POCT WET PREP WITH KOH      Result Value Range   Trichomonas, UA Negative     Clue Cells Wet Prep HPF POC 0-tntc     Epithelial Wet Prep HPF POC 10-tntc     Yeast Wet Prep HPF POC neg     Bacteria Wet  Prep HPF POC 2+     RBC Wet Prep HPF POC 0-3     WBC Wet Prep HPF POC 0-6     KOH Prep POC Positive      Assessment & Plan:  Benign thyroid nodule - Check TFTs. Fax results of thyroid blood work to Dr.  Clemmons at [facility] [phone]   Routine general medical examination at a health care facility - Plan: Pap IG and Chlamydia/Gonococcus, NAA, POCT UA - Microscopic Only, POCT urinalysis dipstick, POCT Wet Prep with KOH, HIV antibody, RPR, Lipid panel, HSV(herpes simplex vrs) 1+2 ab-IgG, Comprehensive metabolic panel, CBC with Differential, TSH, Vitamin D 25 hydroxy, T3, free, T4, free  Bacterial vaginosis  Vaginal moniliasis  Yeast infection of the vagina - Plan: fluconazole (DIFLUCAN) 150 MG tablet - Continue for 4 additional doses starting today so will finish and d/c all diflucan after 10/01/12.

## 2012-09-11 LAB — COMPREHENSIVE METABOLIC PANEL
ALT: 13 U/L (ref 0–35)
Alkaline Phosphatase: 67 U/L (ref 39–117)
CO2: 24 mEq/L (ref 19–32)
Sodium: 137 mEq/L (ref 135–145)
Total Bilirubin: 0.5 mg/dL (ref 0.3–1.2)
Total Protein: 6.8 g/dL (ref 6.0–8.3)

## 2012-09-11 LAB — LIPID PANEL
Cholesterol: 134 mg/dL (ref 0–200)
VLDL: 20 mg/dL (ref 0–40)

## 2012-09-11 LAB — CBC WITH DIFFERENTIAL/PLATELET
Basophils Absolute: 0 10*3/uL (ref 0.0–0.1)
Eosinophils Absolute: 0.1 10*3/uL (ref 0.0–0.7)
Eosinophils Relative: 1 % (ref 0–5)
MCH: 28.7 pg (ref 26.0–34.0)
MCV: 86.7 fL (ref 78.0–100.0)
Platelets: 298 10*3/uL (ref 150–400)
RDW: 12.7 % (ref 11.5–15.5)
WBC: 6.1 10*3/uL (ref 4.0–10.5)

## 2012-09-11 LAB — HIV ANTIBODY (ROUTINE TESTING W REFLEX): HIV: NONREACTIVE

## 2012-09-13 LAB — HSV(HERPES SIMPLEX VRS) I + II AB-IGG: HSV 2 Glycoprotein G Ab, IgG: 1.05 IV — ABNORMAL HIGH

## 2012-09-14 LAB — PAP IG AND CT-NG NAA: GC Probe Amp: NEGATIVE

## 2012-09-27 ENCOUNTER — Ambulatory Visit (INDEPENDENT_AMBULATORY_CARE_PROVIDER_SITE_OTHER): Payer: BC Managed Care – PPO | Admitting: Physician Assistant

## 2012-09-27 VITALS — BP 92/61 | HR 84 | Temp 98.1°F | Resp 16 | Ht 63.0 in | Wt 119.2 lb

## 2012-09-27 DIAGNOSIS — N898 Other specified noninflammatory disorders of vagina: Secondary | ICD-10-CM

## 2012-09-27 DIAGNOSIS — H1013 Acute atopic conjunctivitis, bilateral: Secondary | ICD-10-CM

## 2012-09-27 DIAGNOSIS — H1045 Other chronic allergic conjunctivitis: Secondary | ICD-10-CM

## 2012-09-27 DIAGNOSIS — J309 Allergic rhinitis, unspecified: Secondary | ICD-10-CM

## 2012-09-27 LAB — POCT UA - MICROSCOPIC ONLY
Casts, Ur, LPF, POC: NEGATIVE
Crystals, Ur, HPF, POC: NEGATIVE
Epithelial cells, urine per micros: NEGATIVE
Mucus, UA: NEGATIVE
Yeast, UA: NEGATIVE

## 2012-09-27 LAB — POCT WET PREP WITH KOH
Clue Cells Wet Prep HPF POC: 30
KOH Prep POC: NEGATIVE
Trichomonas, UA: NEGATIVE
Yeast Wet Prep HPF POC: NEGATIVE

## 2012-09-27 LAB — POCT URINALYSIS DIPSTICK
Bilirubin, UA: NEGATIVE
Glucose, UA: NEGATIVE
Ketones, UA: NEGATIVE
Leukocytes, UA: NEGATIVE
Nitrite, UA: NEGATIVE
Protein, UA: NEGATIVE
Spec Grav, UA: >=1.03
Urobilinogen, UA: 0.2
pH, UA: 5.5

## 2012-09-27 MED ORDER — FLUTICASONE PROPIONATE 50 MCG/ACT NA SUSP: 2.0000 | Freq: Every day | NASAL | Status: DC

## 2012-09-27 MED ORDER — AZELASTINE HCL 0.05 % OP SOLN: 1.0000 [drp] | Freq: Two times a day (BID) | OPHTHALMIC | Status: DC

## 2012-09-27 NOTE — Progress Notes (Signed)
Subjective:    Patient ID: Pamela Carr, female    DOB: [birth year], 22 y.o.   MRN: [id number]  HPI 22 year old female presents with several concerns  #1) Recheck vaginal candidiasis.  Was here on 09/10/12 and had a yeast infection secondary to increased stress. Has been seeing Dr. 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  States she was doing well until last week.  States her symptoms have improved but she wants to check and make sure the infection is gone.  Also would like to have her urine checked today although she denies urinary frequency, dysuria, or abdominal pain.    #2) Bilateral eye redness x 1 week.  Does have hx of allergies for which she takes Zyrtec. Admits her allergies have been particularly bad this year.  Also complains of sinus pressure and bilateral ear fullness.  Denies sinus pain, nasal congestion, nasal discharge, fever, chills, headache, nausea, or vomiting.  Has used nasal sprays in the past for illness but does not have them at home anymore.  For the last week her eyes have been red and slightly "irritated."  Denies purulent drainage, vision changes, eye pain, or dizziness.       Review of Systems  Constitutional: Negative for fever and chills.  HENT: Negative for ear pain (bilateral fullness), congestion, sore throat, rhinorrhea, postnasal drip and ear discharge.   Eyes: Positive for redness. Negative for photophobia, pain, discharge and visual disturbance.  Respiratory: Negative for cough.   Gastrointestinal: Negative for nausea and vomiting.  Genitourinary: Positive for vaginal discharge. Negative for dysuria and frequency.  Allergic/Immunologic: Positive for environmental allergies.  Neurological: Negative for dizziness and headaches.       Objective:   Physical Exam  Constitutional: She is oriented to person, place, and time. She appears well-developed and well-nourished.  HENT:  Head: Normocephalic and atraumatic.   Right Ear: Hearing, tympanic membrane, external ear and ear canal normal.  Left Ear: Hearing, tympanic membrane, external ear and ear canal normal.  Mouth/Throat: Uvula is midline, oropharynx is clear and moist and mucous membranes are normal. No oropharyngeal exudate.  Eyes: EOM and lids are normal. Pupils are equal, round, and reactive to light. Right conjunctiva is injected. Left conjunctiva is injected.  Neck: Normal range of motion. Neck supple.  Cardiovascular: Normal rate, regular rhythm and normal heart sounds.   Pulmonary/Chest: Effort normal and breath sounds normal.  Lymphadenopathy:    She has no cervical adenopathy.  Neurological: She is alert and oriented to person, place, and time.  Psychiatric: She has a normal mood and affect. Her behavior is normal. Judgment and thought content normal.    Results for orders placed in visit on 09/27/12  POCT WET PREP WITH KOH      Result Value Range   Trichomonas, UA Negative     Clue Cells Wet Prep HPF POC 30%     Epithelial Wet Prep HPF POC 3-7     Yeast Wet Prep HPF POC neg     Bacteria Wet Prep HPF POC 2+     RBC Wet Prep HPF POC 15-20     WBC Wet Prep HPF POC 1-3     KOH Prep POC Negative    POCT URINALYSIS DIPSTICK      Result Value Range   Color, UA yellow     Clarity, UA sl cloudy     Glucose, UA neg     Bilirubin, UA neg  Ketones, UA neg     Spec Grav, UA >=1.030     Blood, UA large     pH, UA 5.5     Protein, UA neg     Urobilinogen, UA 0.2     Nitrite, UA neg     Leukocytes, UA Negative    POCT UA - MICROSCOPIC ONLY      Result Value Range   WBC, Ur, HPF, POC 0-1     RBC, urine, microscopic 5-7     Bacteria, U Microscopic trace     Mucus, UA neg     Epithelial cells, urine per micros neg     Crystals, Ur, HPF, POC neg     Casts, Ur, LPF, POC neg     Yeast, UA neg            Assessment & Plan:  1. Leukorrhea, not specified as infective - Plan: POCT Wet Prep with KOH, POCT urinalysis dipstick, POCT  UA - Microscopic Only  -No evidence of yeast today  -Only 30% clue cells which is much improved from last visit (TNTC)  -Recommend no treatment at this time. If she develops worsening burning or discharge, ok to refill Flagyl 2. Allergic conjunctivitis, bilateral - Plan: azelastine (OPTIVAR) 0.05 % ophthalmic solution  -Start Optivar twice daily  -If no improvement in 1-2 weeks, recommend evaluation by ophthalmology 3. Allergic rhinitis - Plan: fluticasone (FLONASE) 50 MCG/ACT nasal spray  -Sinus pressure/ETD related to allergies  -Continue Zyrtec daily  -Start Flonase twice daily to help with these symptoms

## 2012-10-05 ENCOUNTER — Ambulatory Visit (INDEPENDENT_AMBULATORY_CARE_PROVIDER_SITE_OTHER): Payer: BC Managed Care – PPO | Admitting: Family Medicine

## 2012-10-05 ENCOUNTER — Other Ambulatory Visit: Payer: Self-pay | Admitting: Family Medicine

## 2012-10-05 ENCOUNTER — Encounter: Payer: Self-pay | Admitting: Family Medicine

## 2012-10-05 VITALS — BP 110/60 | HR 92 | Temp 97.6°F | Resp 19 | Ht 63.5 in | Wt 119.0 lb

## 2012-10-05 DIAGNOSIS — Z79899 Other long term (current) drug therapy: Secondary | ICD-10-CM

## 2012-10-05 DIAGNOSIS — N762 Acute vulvitis: Secondary | ICD-10-CM

## 2012-10-05 DIAGNOSIS — N760 Acute vaginitis: Secondary | ICD-10-CM

## 2012-10-05 DIAGNOSIS — J029 Acute pharyngitis, unspecified: Secondary | ICD-10-CM

## 2012-10-05 LAB — POCT CBC
Granulocyte percent: 30.9 %G — AB (ref 37–80)
Hemoglobin: 14.7 g/dL (ref 12.2–16.2)
MCH, POC: 28.8 pg (ref 27–31.2)
MPV: 8.4 fL (ref 0–99.8)
POC MID %: 11.3 %M (ref 0–12)
RBC: 5.1 M/uL (ref 4.04–5.48)
WBC: 7 10*3/uL (ref 4.6–10.2)

## 2012-10-05 LAB — POCT WET PREP WITH KOH

## 2012-10-05 LAB — POCT UA - MICROSCOPIC ONLY
Casts, Ur, LPF, POC: NEGATIVE
Yeast, UA: NEGATIVE

## 2012-10-05 LAB — POCT URINALYSIS DIPSTICK
Glucose, UA: NEGATIVE
Nitrite, UA: NEGATIVE
Urobilinogen, UA: 0.2

## 2012-10-05 LAB — POCT URINE PREGNANCY: Preg Test, Ur: NEGATIVE

## 2012-10-05 MED ORDER — HYDROCODONE-ACETAMINOPHEN 7.5-325 MG/15ML PO SOLN: 10.0000 mL | Freq: Four times a day (QID) | ORAL | Status: DC | PRN

## 2012-10-05 MED ORDER — MAGIC MOUTHWASH W/LIDOCAINE: 10.0000 mL | ORAL | Status: DC | PRN

## 2012-10-05 NOTE — Progress Notes (Signed)
Subjective:    Patient ID: Pamela Carr, female    DOB: 06/10/1990, 22 y.o.   MRN: [id number]   Chief Complaint  Patient presents with  . sore throat x5 days    was been on abt for 4 days tonsils still swollen  . Vaginitis    HPI  Seen here about 9d ago with red eyes but no burning, itching, pus - using twice a day every day which has worked.  She also restarted flonase which is helping a little. 2 months ago did salt water gargles and hot tea and tonsils resolved but have flaired up for about 24 hrs again.  Had severe pain over the weekend - and got a definitive amoxicillin 875 bid for the past 4d after a + strep test.  Hard to breath, can't lay on side, rt worse than left, and now right hear is getting irritated. No f/c.  Did not take any med today.  Past Medical History  Diagnosis Date  . Vaginal yeast infection   . UTI (urinary tract infection)    Current Outpatient Prescriptions on File Prior to Visit  Medication Sig Dispense Refill  . azelastine (OPTIVAR) 0.05 % ophthalmic solution Place 1 drop into both eyes 2 (two) times daily.  6 mL  3  . cetirizine (ZYRTEC) 10 MG tablet Take 1 tablet (10 mg total) by mouth daily.  30 tablet  5  . fluticasone (FLONASE) 50 MCG/ACT nasal spray Place 2 sprays into the nose daily.  16 g  5  . Multiple Vitamin (MULTIVITAMIN) tablet Take 1 tablet by mouth daily.      . ranitidine (ZANTAC) 150 MG tablet Take 150 mg by mouth as needed.       No current facility-administered medications on file prior to visit.   No Known Allergies  Review of Systems  Constitutional: Positive for appetite change. Negative for fever, chills, diaphoresis and fatigue.  HENT: Positive for hearing loss, ear pain, congestion, sore throat, rhinorrhea, trouble swallowing, neck pain and voice change. Negative for nosebleeds, sneezing, drooling, mouth sores, neck stiffness, postnasal drip, sinus pressure and ear discharge.   Eyes: Negative for photophobia, pain, discharge,  redness, itching and visual disturbance.  Respiratory: Positive for shortness of breath. Negative for cough.   Cardiovascular: Positive for chest pain.  Gastrointestinal: Negative for nausea, vomiting, abdominal pain, diarrhea and constipation.  Genitourinary: Positive for vaginal discharge and vaginal pain. Negative for dysuria.  Musculoskeletal: Positive for myalgias. Negative for joint swelling, arthralgias and gait problem.  Neurological: Negative for dizziness, syncope and headaches.  Hematological: Positive for adenopathy.  Psychiatric/Behavioral: Positive for sleep disturbance.      BP 110/60  Pulse 92  Temp(Src) 97.6 F (36.4 C) (Oral)  Resp 19  Ht 5' 3.5" (1.613 m)  Wt 119 lb (53.978 kg)  BMI 20.75 kg/m2  SpO2 98%  LMP 09/26/2012 Objective:   Physical Exam  Constitutional: She is oriented to person, place, and time. She appears well-developed and well-nourished. No distress.  HENT:  Head: Normocephalic and atraumatic.  Right Ear: Tympanic membrane, external ear and ear canal normal.  Left Ear: Tympanic membrane, external ear and ear canal normal.  Nose: Nose normal. No mucosal edema or rhinorrhea.  Mouth/Throat: Uvula is midline and mucous membranes are normal. Mucous membranes are not pale and not dry. No trismus in the jaw. No edematous. Oropharyngeal exudate, posterior oropharyngeal edema and posterior oropharyngeal erythema present. No tonsillar abscesses.  Eyes: Conjunctivae are normal. Right eye exhibits no discharge. Left  eye exhibits no discharge. No scleral icterus.  Neck: Normal range of motion. Neck supple.  Cardiovascular: Normal rate, regular rhythm, normal heart sounds and intact distal pulses.   Pulmonary/Chest: Effort normal and breath sounds normal. No respiratory distress.  Genitourinary: There is no rash, tenderness, lesion or injury on the right labia. There is no rash, tenderness, lesion or injury on the left labia. Cervix exhibits no motion tenderness  and no friability. No erythema, tenderness or bleeding around the vagina. No foreign body around the vagina. Vaginal discharge found.  Lymphadenopathy:       Head (right side): Submandibular and tonsillar adenopathy present. No preauricular, no posterior auricular and no occipital adenopathy present.       Head (left side): Submandibular and tonsillar adenopathy present. No preauricular, no posterior auricular and no occipital adenopathy present.    She has cervical adenopathy.       Right cervical: Superficial cervical adenopathy present. No posterior cervical adenopathy present.      Left cervical: No posterior cervical adenopathy present.       Right: No supraclavicular adenopathy present.       Left: No supraclavicular adenopathy present.  Neurological: She is alert and oriented to person, place, and time.  Skin: Skin is warm and dry. She is not diaphoretic. No erythema.  Psychiatric: She has a normal mood and affect. Her behavior is normal.          Assessment & Plan:  Vulvitis - Plan: POCT CBC, Epstein-Barr virus VCA antibody panel, POCT Wet Prep with KOH, POCT rapid strep A, POCT urine pregnancy, HSV 1 AND 2 IGM ABS, INDIRECT, POCT UA - Microscopic Only, POCT urinalysis dipstick  Acute pharyngitis - Plan: POCT CBC, Epstein-Barr virus VCA antibody panel, POCT Wet Prep with KOH, POCT rapid strep A, POCT urine pregnancy, HSV 1 AND 2 IGM ABS, INDIRECT, Culture, Group A Strep Cont amoxicillin though strep test neg today due to h/o + strep at other UC. Start freq mouth washes. Meds ordered this encounter  Medications  . amoxicillin (AMOXIL) 875 MG tablet    Sig: Take 875 mg by mouth 2 (two) times daily.  Marland Kitchen ibuprofen (ADVIL,MOTRIN) 200 MG tablet    Sig: Take 200 mg by mouth every 6 (six) hours as needed for pain.  Marland Kitchen Alum & Mag Hydroxide-Simeth (MAGIC MOUTHWASH W/LIDOCAINE) SOLN    Sig: Take 10 mLs by mouth every 2 (two) hours as needed.    Dispense:  360 mL    Refill:  0  .  HYDROcodone-acetaminophen (HYCET) 7.5-325 mg/15 ml solution    Sig: Take 10 mLs by mouth every 6 (six) hours as needed for pain.    Dispense:  160 mL    Refill:  0

## 2012-10-06 LAB — COMPLETE METABOLIC PANEL WITH GFR
ALT: 95 U/L — ABNORMAL HIGH (ref 0–35)
AST: 53 U/L — ABNORMAL HIGH (ref 0–37)
CO2: 23 mEq/L (ref 19–32)
Creat: 0.75 mg/dL (ref 0.50–1.10)
GFR, Est African American: >89 mL/min
Total Bilirubin: 0.5 mg/dL (ref 0.3–1.2)

## 2012-10-06 LAB — EPSTEIN-BARR VIRUS VCA ANTIBODY PANEL
EBV EA IgG: 65.8 U/mL — ABNORMAL HIGH (ref <9.0)
EBV NA IgG: <3 U/mL (ref <18.0)

## 2012-10-06 LAB — HSV 1 AND 2 IGM ABS, INDIRECT

## 2012-10-07 LAB — CULTURE, GROUP A STREP: Organism ID, Bacteria: NORMAL

## 2012-10-09 ENCOUNTER — Ambulatory Visit (INDEPENDENT_AMBULATORY_CARE_PROVIDER_SITE_OTHER): Payer: BC Managed Care – PPO | Admitting: Family Medicine

## 2012-10-09 VITALS — BP 114/80 | HR 102 | Temp 98.1°F | Resp 16 | Ht 62.5 in | Wt 119.2 lb

## 2012-10-09 DIAGNOSIS — B9689 Other specified bacterial agents as the cause of diseases classified elsewhere: Secondary | ICD-10-CM

## 2012-10-09 DIAGNOSIS — R21 Rash and other nonspecific skin eruption: Secondary | ICD-10-CM

## 2012-10-09 DIAGNOSIS — B2790 Infectious mononucleosis, unspecified without complication: Secondary | ICD-10-CM

## 2012-10-09 DIAGNOSIS — T148XXA Other injury of unspecified body region, initial encounter: Secondary | ICD-10-CM

## 2012-10-09 DIAGNOSIS — N760 Acute vaginitis: Secondary | ICD-10-CM

## 2012-10-09 DIAGNOSIS — N898 Other specified noninflammatory disorders of vagina: Secondary | ICD-10-CM

## 2012-10-09 LAB — POCT WET PREP WITH KOH
Epithelial Wet Prep HPF POC: NEGATIVE
Trichomonas, UA: NEGATIVE
Yeast Wet Prep HPF POC: NEGATIVE

## 2012-10-09 LAB — POCT UA - MICROSCOPIC ONLY
Epithelial cells, urine per micros: NEGATIVE
WBC, Ur, HPF, POC: NEGATIVE
Yeast, UA: NEGATIVE

## 2012-10-09 LAB — POCT URINALYSIS DIPSTICK
Bilirubin, UA: NEGATIVE
Glucose, UA: NEGATIVE
Ketones, UA: NEGATIVE
Leukocytes, UA: NEGATIVE
Nitrite, UA: NEGATIVE
pH, UA: 6

## 2012-10-09 MED ORDER — METRONIDAZOLE 500 MG PO TABS: 500.0000 mg | ORAL_TABLET | Freq: Two times a day (BID) | ORAL | Status: DC

## 2012-10-09 MED ORDER — PREDNISONE 10 MG PO TABS: ORAL_TABLET | ORAL | Status: DC

## 2012-10-09 NOTE — Progress Notes (Signed)
Subjective:    Patient ID: Pamela Carr, female    DOB: 08-Mar-1991, 22 y.o.   MRN: [id number] Chief Complaint  Patient presents with  . Rash    on stomach and chest- She was put on Amoxicillin 10/01/2012 in [place]  . Vaginal Discharge    from antibiotics    HPI   Pamela Carr was diagnosed with strep on 10/01/12 in [place] after + rapid strep test and put on amox.  At f/u in our office on 7/8 as she was still having severe tonsil swelling and exudate, her strep test was negative but her test for mono came back +.  Her LFTs were mildly elev.  She had finished 5d of the amox w/o incidence so I counselled her to complete the rest due to her h/o + strep test.  Yesterday, her tonsils improved significantly - no further sore throat, almost back to baseline. Energy good, no abd pain.  However, today she had severe episode of diarrhea. Later in the day, she felt a very tender place of her labia and noticed that she had a rash all over her abd which seems to be spreading.  Has 3d left of her 10d amox course. Has not taken any diflucan.  No tylenol, no alcohol.  Past Medical History  Diagnosis Date  . Vaginal yeast infection   . UTI (urinary tract infection)    Current Outpatient Prescriptions on File Prior to Visit  Medication Sig Dispense Refill  . Alum & Mag Hydroxide-Simeth (MAGIC MOUTHWASH W/LIDOCAINE) SOLN Take 10 mLs by mouth every 2 (two) hours as needed.  360 mL  0  . AMBULATORY NON FORMULARY MEDICATION Medication Name: Boric Acid Capsules 600mg   Insert 1 capsule into the vagina twice each week.  24 capsule  12  . azelastine (OPTIVAR) 0.05 % ophthalmic solution Place 1 drop into both eyes 2 (two) times daily.  6 mL  3  . cetirizine (ZYRTEC) 10 MG tablet Take 1 tablet (10 mg total) by mouth daily.  30 tablet  5  . fluticasone (FLONASE) 50 MCG/ACT nasal spray Place 2 sprays into the nose daily.  16 g  5  . ibuprofen (ADVIL,MOTRIN) 200 MG tablet Take 200 mg by mouth every 6 (six) hours as  needed for pain.      . Multiple Vitamin (MULTIVITAMIN) tablet Take 1 tablet by mouth daily.      . norethindrone-ethinyl estradiol-iron (MICROGESTIN FE,GILDESS FE,LOESTRIN FE) 1.5-30 MG-MCG tablet Take 1 tablet by mouth daily.  1 Package  11  . ranitidine (ZANTAC) 150 MG tablet Take 150 mg by mouth as needed.      Marland Kitchen estradiol (ESTRACE) 0.1 MG/GM vaginal cream Place 2 g vaginally daily as needed.       No current facility-administered medications on file prior to visit.   No Known Allergies  Review of Systems  Constitutional: Positive for fatigue. Negative for fever, chills, diaphoresis, activity change and appetite change.  HENT: Negative for congestion, sore throat, rhinorrhea and mouth sores.   Respiratory: Negative for cough.   Gastrointestinal: Positive for diarrhea. Negative for nausea, vomiting, abdominal pain and constipation.  Genitourinary: Positive for vaginal discharge, genital sores and vaginal pain. Negative for dysuria, urgency, frequency, decreased urine volume and dyspareunia.  Musculoskeletal: Negative for myalgias and arthralgias.  Skin: Positive for rash. Negative for wound.      BP 114/80  Pulse 102  Temp(Src) 98.1 F (36.7 C) (Oral)  Resp 16  Ht 5' 2.5" (1.588 m)  Wt 119 lb  3.2 oz (54.069 kg)  BMI 21.44 kg/m2  SpO2 98%  LMP 09/26/2012 Objective:   Physical Exam  Constitutional: She is oriented to person, place, and time. She appears well-developed and well-nourished. No distress.  HENT:  Head: Normocephalic and atraumatic.  Cardiovascular: Normal rate, regular rhythm, normal heart sounds and intact distal pulses.   Pulmonary/Chest: Effort normal and breath sounds normal.  Abdominal: Soft. Bowel sounds are normal. She exhibits no distension. There is no tenderness. There is no rebound and no guarding.  Genitourinary: Uterus normal. Pelvic exam was performed with patient supine. There is no rash, tenderness or lesion on the right labia. There is no rash,  tenderness or lesion on the left labia. Cervix exhibits no motion tenderness and no friability. Right adnexum displays no mass, no tenderness and no fullness. Left adnexum displays no mass, no tenderness and no fullness. No erythema or tenderness around the vagina. Vaginal discharge found.  Moderate amount of thick white discharge. 3 thin linear abrasions seen on upper outer left labia minora along mucosal folds/langerhans lines.  Lymphadenopathy:       Right: No inguinal adenopathy present.       Left: No inguinal adenopathy present.  Neurological: She is alert and oriented to person, place, and time.  Skin: Skin is warm and dry. Rash noted. She is not diaphoretic.  Fine morbilliform lacy mildly erythematous non-blanching macular rash over trunk - abd, chest, back.  Psychiatric: She has a normal mood and affect. Her behavior is normal.          Results for orders placed in visit on 10/09/12  POCT UA - MICROSCOPIC ONLY      Result Value Range   WBC, Ur, HPF, POC neg     RBC, urine, microscopic 1-2     Bacteria, U Microscopic trace     Mucus, UA neg     Epithelial cells, urine per micros neg     Crystals, Ur, HPF, POC neg     Casts, Ur, LPF, POC neg'     Yeast, UA neg    POCT URINALYSIS DIPSTICK      Result Value Range   Color, UA yellow     Clarity, UA clear     Glucose, UA neg     Bilirubin, UA neg'     Ketones, UA neg     Spec Grav, UA 1.010     Blood, UA trace     pH, UA 6.0     Protein, UA neg     Urobilinogen, UA 0.2     Nitrite, UA neg     Leukocytes, UA Negative    POCT WET PREP WITH KOH      Result Value Range   Trichomonas, UA Negative     Clue Cells Wet Prep HPF POC tntc     Epithelial Wet Prep HPF POC neg     Yeast Wet Prep HPF POC neg     Bacteria Wet Prep HPF POC 2+     RBC Wet Prep HPF POC 0-1     WBC Wet Prep HPF POC 1-4     KOH Prep POC Negative      Assessment & Plan:  Abrasion - Plan: Herpes simplex virus culture - no concern about initial HSV  outbreak from apperance on exam - clearly abrasion from wiping after extensive diarrhea.  Vaginal discharge - Plan: POCT UA - Microscopic Only, POCT urinalysis dipstick, POCT Wet Prep with KOH  Infectious mononucleosis - Plan:  Comprehensive metabolic panel - sig improved, likely almost resolved.  Bacterial vaginosis  Rash and nonspecific skin eruption - due to use of amoxicillin in mono.  Start zyrtec. If becomes very pruritic or spreading towards face, ok to try prednisone (hard copy of rx given) but likely won't need.  D/C amox immed (7d course completed).  Meds ordered this encounter  Medications  . DISCONTD: predniSONE (DELTASONE) 10 MG tablet    Sig: Take 6 tabs po d1, 5 tabs po d2, 4 tabs po d3, 3 tabs po d4, 2 tabs po d5, 1 tab po d6.    Dispense:  21 tablet    Refill:  0  . DISCONTD: metroNIDAZOLE (FLAGYL) 500 MG tablet    Sig: Take 1 tablet (500 mg total) by mouth 2 (two) times daily with a meal. DO NOT CONSUME ALCOHOL WHILE TAKING THIS MEDICATION.    Dispense:  14 tablet    Refill:  0  . metroNIDAZOLE (FLAGYL) 500 MG tablet    Sig: Take 1 tablet (500 mg total) by mouth 2 (two) times daily with a meal. DO NOT CONSUME ALCOHOL WHILE TAKING THIS MEDICATION.    Dispense:  14 tablet    Refill:  0

## 2012-10-10 LAB — COMPREHENSIVE METABOLIC PANEL
Albumin: 3.8 g/dL (ref 3.5–5.2)
Alkaline Phosphatase: 82 U/L (ref 39–117)
BUN: 9 mg/dL (ref 6–23)
CO2: 27 mEq/L (ref 19–32)
Calcium: 8.9 mg/dL (ref 8.4–10.5)
Glucose, Bld: 91 mg/dL (ref 70–99)
Potassium: 3.8 mEq/L (ref 3.5–5.3)
Sodium: 139 mEq/L (ref 135–145)
Total Protein: 7 g/dL (ref 6.0–8.3)

## 2012-10-13 ENCOUNTER — Encounter: Payer: Self-pay | Admitting: Family Medicine

## 2012-10-13 LAB — HERPES SIMPLEX VIRUS CULTURE: Organism ID, Bacteria: NOT DETECTED

## 2012-10-17 ENCOUNTER — Encounter: Payer: Self-pay | Admitting: Family Medicine

## 2012-11-04 ENCOUNTER — Ambulatory Visit (INDEPENDENT_AMBULATORY_CARE_PROVIDER_SITE_OTHER): Payer: BC Managed Care – PPO | Admitting: Family Medicine

## 2012-11-04 VITALS — BP 110/60 | HR 84 | Temp 98.2°F | Resp 16 | Ht 62.5 in | Wt 117.0 lb

## 2012-11-04 DIAGNOSIS — N898 Other specified noninflammatory disorders of vagina: Secondary | ICD-10-CM

## 2012-11-04 DIAGNOSIS — N926 Irregular menstruation, unspecified: Secondary | ICD-10-CM

## 2012-11-04 DIAGNOSIS — R300 Dysuria: Secondary | ICD-10-CM

## 2012-11-04 DIAGNOSIS — N760 Acute vaginitis: Secondary | ICD-10-CM

## 2012-11-04 DIAGNOSIS — B9689 Other specified bacterial agents as the cause of diseases classified elsewhere: Secondary | ICD-10-CM

## 2012-11-04 DIAGNOSIS — K6289 Other specified diseases of anus and rectum: Secondary | ICD-10-CM

## 2012-11-04 DIAGNOSIS — N920 Excessive and frequent menstruation with regular cycle: Secondary | ICD-10-CM

## 2012-11-04 LAB — POCT URINALYSIS DIPSTICK
Glucose, UA: NEGATIVE
Leukocytes, UA: NEGATIVE
Protein, UA: NEGATIVE
Spec Grav, UA: 1.025
Urobilinogen, UA: 0.2

## 2012-11-04 LAB — POCT WET PREP WITH KOH
RBC Wet Prep HPF POC: NEGATIVE
Trichomonas, UA: NEGATIVE
Yeast Wet Prep HPF POC: NEGATIVE

## 2012-11-04 LAB — POCT UA - MICROSCOPIC ONLY: Crystals, Ur, HPF, POC: NEGATIVE

## 2012-11-04 LAB — IFOBT (OCCULT BLOOD): IFOBT: NEGATIVE

## 2012-11-04 MED ORDER — METRONIDAZOLE 500 MG PO TABS: 500.0000 mg | ORAL_TABLET | Freq: Two times a day (BID) | ORAL | Status: DC

## 2012-11-04 NOTE — Progress Notes (Signed)
[address]   [place], [place]  [address]   [phone]  Subjective:    Patient ID: Pamela Carr, female    DOB: 06-12-1990, 22 y.o.   MRN: [id number]  HPI This 22 y.o. female presents for evaluation of vaginal discharge; worried about BV and yeast.  Recently on Amoxicillin recenlty and then had menses.  Requesting pregnancy test; menses short; switched OCP recently.  Mild dysuria.  Vaginal discharge is white, thin, mostly white.  Mild vaginal itching.  Sexually active but boyfriend deployed; last encounter 10/27/12.  Not concerned about STDs; just had STD check three months ago.  2.  RLE insect bite: burning today after scratching it.  No swelling; mild redness.  No drainage.  History of infected insect bite; worried about infection; has not taken any medication; tends to be quite allergic to insect bites.  3.  R side of buttocks is sensitive.   Has a desk job; no redness to skin but unable to visualize well; no pain with b.m. No bloody stools; no constipation.  No recent fall or trauma to area.   Review of Systems  Constitutional: Negative for fever, chills, diaphoresis and fatigue.  Gastrointestinal: Positive for rectal pain. Negative for nausea, vomiting, abdominal pain, diarrhea, constipation, blood in stool and anal bleeding.  Genitourinary: Positive for dysuria, vaginal discharge and menstrual problem. Negative for urgency, frequency, hematuria, flank pain, vaginal bleeding, genital sores and pelvic pain.  Skin: Positive for wound. Negative for rash.    Past Medical History  Diagnosis Date  . Vaginal yeast infection   . UTI (urinary tract infection)     Past Surgical History  Procedure Laterality Date  . Fracture surgery    . Wisdom teeth extraction      Prior to Admission medications   Medication Sig Start Date End Date Taking? Authorizing Provider  norethindrone-ethinyl estradiol-iron (MICROGESTIN FE,GILDESS FE,LOESTRIN FE) 1.5-30 MG-MCG tablet Take 1 tablet by mouth  daily. 06/25/12  Yes Ethelda Chick, MD  ranitidine (ZANTAC) 150 MG tablet Take 150 mg by mouth as needed. 03/12/12  Yes Sherren Mocha, MD  azelastine (OPTIVAR) 0.05 % ophthalmic solution Place 1 drop into both eyes 2 (two) times daily. 09/27/12   Heather Jaquita Rector, PA-C  cetirizine (ZYRTEC) 10 MG tablet Take 1 tablet (10 mg total) by mouth daily. 07/05/12   Sherren Mocha, MD  fluticasone (FLONASE) 50 MCG/ACT nasal spray Place 2 sprays into the nose daily. 09/27/12   Nelva Nay, PA-C  Multiple Vitamin (MULTIVITAMIN) tablet Take 1 tablet by mouth daily.    Historical Provider, MD    No Known Allergies  History   Social History  . Marital Status: Single    Spouse Name: N/A    Number of Children: N/A  . Years of Education: N/A   Occupational History  . Not on file.   Social History Main Topics  . Smoking status: Never Smoker   . Smokeless tobacco: Never Used  . Alcohol Use: No     Comment: 2 x per month  . Drug Use: No  . Sexually Active: Yes    Birth Control/ Protection: Pill     Comment: number of sex partners in the last 12 months  2   Other Topics Concern  . Not on file   Social History Narrative   Significant other. Education: [employer]. Exercise: Cardio Strengths 3 times a week for 25 minutes.    Family History  Problem Relation Age of Onset  .  Allergies Father   . Hypertension Maternal Grandmother   . Asthma Maternal Grandfather   . Allergies Maternal Grandfather   . Arthritis Paternal Grandmother     rheumatoid       Objective:   Physical Exam  Nursing note and vitals reviewed. Constitutional: She is oriented to person, place, and time. She appears well-developed and well-nourished.  HENT:  Head: Normocephalic and atraumatic.  Cardiovascular: Normal rate and regular rhythm.   Pulmonary/Chest: Effort normal.  Abdominal: Soft. Bowel sounds are normal. She exhibits no distension and no mass. There is no tenderness. There is no rebound and no guarding.  Genitourinary:  Rectum normal and uterus normal. Rectal exam shows no external hemorrhoid, no mass and no tenderness. There is no rash, tenderness or lesion on the right labia. There is no rash, tenderness or lesion on the left labia. Cervix exhibits discharge. Cervix exhibits no motion tenderness and no friability. Right adnexum displays no mass, no tenderness and no fullness. Left adnexum displays no mass, no tenderness and no fullness. Vaginal discharge found.  Neurological: She is alert and oriented to person, place, and time.  Psychiatric: She has a normal mood and affect. Her behavior is normal.   Results for orders placed in visit on 11/04/12  POCT URINE PREGNANCY      Result Value Range   Preg Test, Ur Negative    POCT URINALYSIS DIPSTICK      Result Value Range   Color, UA yellow     Clarity, UA clear     Glucose, UA neg     Bilirubin, UA neg     Ketones, UA trace     Spec Grav, UA 1.025     Blood, UA neg     pH, UA 5.5     Protein, UA neg     Urobilinogen, UA 0.2     Nitrite, UA neg     Leukocytes, UA Negative    POCT UA - MICROSCOPIC ONLY      Result Value Range   WBC, Ur, HPF, POC 4-6     RBC, urine, microscopic 0-3     Bacteria, U Microscopic 1+     Mucus, UA positive     Epithelial cells, urine per micros 0-1     Crystals, Ur, HPF, POC neg     Casts, Ur, LPF, POC neg     Yeast, UA neg    POCT WET PREP WITH KOH      Result Value Range   Trichomonas, UA Negative     Clue Cells Wet Prep HPF POC 7-tntc     Epithelial Wet Prep HPF POC 1-5     Yeast Wet Prep HPF POC neg     Bacteria Wet Prep HPF POC 4+     RBC Wet Prep HPF POC neg     WBC Wet Prep HPF POC 5-12     KOH Prep POC Negative    IFOBT (OCCULT BLOOD)      Result Value Range   IFOBT Negative         Assessment & Plan:  Dysuria - Plan: POCT urinalysis dipstick, POCT UA - Microscopic Only  Abnormal menstrual periods - Plan: POCT urine pregnancy  Vaginal discharge - Plan: POCT Wet Prep with KOH  Pain of perianal  area - Plan: IFOBT POC (occult bld, rslt in office)  BV (bacterial vaginosis) - Plan: metroNIDAZOLE (FLAGYL) 500 MG tablet   1. BV:  New.  Rx fro Metronidazole bid x  7 days. 2.  Abnormal menses: light:  Urine pregnancy negative; recent switch in OCP. 3.  Pain in buttocks/perianal region:  New.  Benign exam; benign rectal exam; hemosure negative. 4.  Insect bite RLE:  New.  No evidence of secondary infection ;recommend Zyrtec daily and Benadryl qhs.  RTC for increasing redness, pain, warmth.  Meds ordered this encounter  Medications  . metroNIDAZOLE (FLAGYL) 500 MG tablet    Sig: Take 1 tablet (500 mg total) by mouth 2 (two) times daily.    Dispense:  14 tablet    Refill:  0

## 2012-11-17 ENCOUNTER — Encounter: Payer: Self-pay | Admitting: Family Medicine

## 2012-11-17 ENCOUNTER — Ambulatory Visit (INDEPENDENT_AMBULATORY_CARE_PROVIDER_SITE_OTHER): Payer: BC Managed Care – PPO | Admitting: Family Medicine

## 2012-11-17 VITALS — BP 100/80 | HR 79 | Temp 97.0°F | Resp 16 | Ht 63.0 in | Wt 120.0 lb

## 2012-11-17 DIAGNOSIS — B9689 Other specified bacterial agents as the cause of diseases classified elsewhere: Secondary | ICD-10-CM

## 2012-11-17 DIAGNOSIS — N760 Acute vaginitis: Secondary | ICD-10-CM

## 2012-11-17 DIAGNOSIS — R300 Dysuria: Secondary | ICD-10-CM

## 2012-11-17 DIAGNOSIS — N898 Other specified noninflammatory disorders of vagina: Secondary | ICD-10-CM

## 2012-11-17 LAB — POCT UA - MICROSCOPIC ONLY
Bacteria, U Microscopic: NEGATIVE
Casts, Ur, LPF, POC: NEGATIVE
Crystals, Ur, HPF, POC: NEGATIVE
Mucus, UA: NEGATIVE
RBC, urine, microscopic: NEGATIVE

## 2012-11-17 LAB — POCT URINALYSIS DIPSTICK
Bilirubin, UA: NEGATIVE
Glucose, UA: NEGATIVE
Ketones, UA: NEGATIVE
Nitrite, UA: NEGATIVE
Spec Grav, UA: 1.01
pH, UA: 6

## 2012-11-17 LAB — POCT WET PREP WITH KOH: Clue Cells Wet Prep HPF POC: 100

## 2012-11-17 MED ORDER — TINIDAZOLE 500 MG PO TABS: 2.0000 g | ORAL_TABLET | Freq: Every day | ORAL | Status: DC

## 2012-11-17 MED ORDER — NORETHINDRONE ACET-ETHINYL EST 1-20 MG-MCG PO TABS: 1.0000 | ORAL_TABLET | Freq: Every day | ORAL | Status: DC

## 2012-11-17 NOTE — Progress Notes (Signed)
Subjective:    Patient ID: Pamela Carr, female    DOB: Jun 02, 1990, 22 y.o.   MRN: [id number]  Chief Complaint  Patient presents with  . Urinary Tract Infection    HPI  Over the past few days has had a little increase in LLQ abd pain.  Yesterday it burned when she voided a little. Vaginal discharge is the same but does seem to have an odor - no itching.  No f/c, no back pain, no n/v.   Past Medical History  Diagnosis Date  . Vaginal yeast infection   . UTI (urinary tract infection)    Current Outpatient Prescriptions on File Prior to Visit  Medication Sig Dispense Refill  . azelastine (OPTIVAR) 0.05 % ophthalmic solution Place 1 drop into both eyes 2 (two) times daily.  6 mL  3  . cetirizine (ZYRTEC) 10 MG tablet Take 1 tablet (10 mg total) by mouth daily.  30 tablet  5  . fluticasone (FLONASE) 50 MCG/ACT nasal spray Place 2 sprays into the nose daily.  16 g  5  . metroNIDAZOLE (FLAGYL) 500 MG tablet Take 1 tablet (500 mg total) by mouth 2 (two) times daily.  14 tablet  0  . Multiple Vitamin (MULTIVITAMIN) tablet Take 1 tablet by mouth daily.      . norethindrone-ethinyl estradiol-iron (MICROGESTIN FE,GILDESS FE,LOESTRIN FE) 1.5-30 MG-MCG tablet Take 1 tablet by mouth daily.  1 Package  11  . ranitidine (ZANTAC) 150 MG tablet Take 150 mg by mouth as needed.       No current facility-administered medications on file prior to visit.     Review of Systems  Constitutional: Negative for fever, chills, diaphoresis, activity change, appetite change, fatigue and unexpected weight change.  Gastrointestinal: Positive for abdominal pain. Negative for diarrhea, constipation, blood in stool, anal bleeding and rectal pain.  Genitourinary: Positive for dysuria, vaginal discharge and pelvic pain. Negative for urgency, frequency, hematuria, decreased urine volume, vaginal bleeding, difficulty urinating, genital sores, vaginal pain, menstrual problem and dyspareunia.  Musculoskeletal: Negative for  gait problem.  Skin: Negative for rash.  Hematological: Negative for adenopathy.  Psychiatric/Behavioral: The patient is not nervous/anxious.       BP 100/80  Pulse 79  Temp(Src) 97 F (36.1 C) (Oral)  Resp 16  Ht 5\' 3"  (1.6 m)  Wt 120 lb (54.432 kg)  BMI 21.26 kg/m2  SpO2 100%  LMP 10/24/2012 Objective:   Physical Exam  Constitutional: She is oriented to person, place, and time. She appears well-developed and well-nourished. No distress.  HENT:  Head: Normocephalic and atraumatic.  Cardiovascular: Normal rate, regular rhythm, normal heart sounds and intact distal pulses.   Pulmonary/Chest: Effort normal and breath sounds normal.  Abdominal: Soft. Bowel sounds are normal. She exhibits no distension. There is no tenderness. There is no rebound and no guarding.  Genitourinary: Uterus normal. Pelvic exam was performed with patient supine. There is no rash, tenderness or lesion on the right labia. There is no rash, tenderness or lesion on the left labia. Cervix exhibits no motion tenderness and no friability. Right adnexum displays no mass, no tenderness and no fullness. Left adnexum displays no mass, no tenderness and no fullness. No erythema or tenderness around the vagina. Vaginal discharge found.  Lymphadenopathy:       Right: No inguinal adenopathy present.       Left: No inguinal adenopathy present.  Neurological: She is alert and oriented to person, place, and time.  Skin: Skin is warm and dry. She  is not diaphoretic.  Psychiatric: She has a normal mood and affect. Her behavior is normal.          Results for orders placed in visit on 11/17/12  POCT URINALYSIS DIPSTICK      Result Value Range   Color, UA yellow     Clarity, UA clear     Glucose, UA neg     Bilirubin, UA neg     Ketones, UA neg     Spec Grav, UA 1.010     Blood, UA trace-intact     pH, UA 6.0     Protein, UA neg     Urobilinogen, UA 0.2     Nitrite, UA neg     Leukocytes, UA Negative    POCT UA -  MICROSCOPIC ONLY      Result Value Range   WBC, Ur, HPF, POC 0-1     RBC, urine, microscopic neg     Bacteria, U Microscopic neg     Mucus, UA neg     Epithelial cells, urine per micros 0-3     Crystals, Ur, HPF, POC neg     Casts, Ur, LPF, POC neg     Yeast, UA neg    POCT WET PREP WITH KOH      Result Value Range   Trichomonas, UA Negative     Clue Cells Wet Prep HPF POC 100%     Epithelial Wet Prep HPF POC 2-10     Yeast Wet Prep HPF POC neg     Bacteria Wet Prep HPF POC 4+     RBC Wet Prep HPF POC 0-1     WBC Wet Prep HPF POC 5-15     KOH Prep POC Negative      Assessment & Plan:   Vaginal discharge - Plan: POCT urinalysis dipstick, POCT UA - Microscopic Only, POCT Wet Prep with KOH, Urine culture  Dysuria - Plan: POCT urinalysis dipstick, POCT UA - Microscopic Only, POCT Wet Prep with KOH, Urine culture  Bacterial vaginosis  Meds ordered this encounter  Medications  . DISCONTD: tinidazole (TINDAMAX) 500 MG tablet    Sig: Take 4 tablets (2,000 mg total) by mouth daily with breakfast.    Dispense:  8 tablet    Refill:  0  . norethindrone-ethinyl estradiol (LOESTRIN 1/20, 21,) 1-20 MG-MCG tablet    Sig: Take 1 tablet by mouth daily.    Dispense:  1 Package    Refill:  11  . tinidazole (TINDAMAX) 500 MG tablet    Sig: Take 4 tablets (2,000 mg total) by mouth daily with breakfast.    Dispense:  8 tablet    Refill:  0     Norberto Sorenson, MD MPH

## 2012-11-19 LAB — URINE CULTURE
Colony Count: NO GROWTH
Organism ID, Bacteria: NO GROWTH

## 2012-12-01 ENCOUNTER — Ambulatory Visit (INDEPENDENT_AMBULATORY_CARE_PROVIDER_SITE_OTHER): Payer: BC Managed Care – PPO | Admitting: Family Medicine

## 2012-12-01 VITALS — BP 98/62 | HR 80 | Temp 98.1°F | Resp 20 | Ht 68.0 in | Wt 117.8 lb

## 2012-12-01 DIAGNOSIS — B9689 Other specified bacterial agents as the cause of diseases classified elsewhere: Secondary | ICD-10-CM

## 2012-12-01 DIAGNOSIS — R300 Dysuria: Secondary | ICD-10-CM

## 2012-12-01 DIAGNOSIS — N760 Acute vaginitis: Secondary | ICD-10-CM

## 2012-12-01 LAB — POCT URINALYSIS DIPSTICK
Bilirubin, UA: NEGATIVE
Blood, UA: NEGATIVE
Glucose, UA: NEGATIVE
Leukocytes, UA: NEGATIVE
Nitrite, UA: NEGATIVE
Protein, UA: NEGATIVE
Spec Grav, UA: 1.025
Urobilinogen, UA: 0.2
pH, UA: 5.5

## 2012-12-01 LAB — POCT UA - MICROSCOPIC ONLY
Casts, Ur, LPF, POC: NEGATIVE
Crystals, Ur, HPF, POC: NEGATIVE
Yeast, UA: NEGATIVE

## 2012-12-01 LAB — POCT WET PREP WITH KOH
KOH Prep POC: NEGATIVE
Trichomonas, UA: NEGATIVE

## 2012-12-01 MED ORDER — TINIDAZOLE 500 MG PO TABS: 2.0000 g | ORAL_TABLET | Freq: Every day | ORAL | Status: DC

## 2012-12-01 MED ORDER — METRONIDAZOLE 0.75 % VA GEL: 1.0000 | Freq: Every day | VAGINAL | Status: AC

## 2012-12-01 MED ORDER — AMBULATORY NON FORMULARY MEDICATION: Status: AC

## 2012-12-01 NOTE — Progress Notes (Signed)
Subjective:    Patient ID: Pamela Carr, female    DOB: 01/13/1991, 22 y.o.   MRN: [id number] Chief Complaint  Patient presents with  . Follow-up    from previous visit of UTI, VD, pt states she is still having mild burning and discharge   HPI  Sxs dramatically improved after taking tindamax - period started immed after starting it.  Did not switch to lower hormone dose of OCP as didn't want to have breakthrough bleeding potentially during her vacation.  But for the past 2d has had more vaginal discharge.  Has started developing some slight dysuria and pelvic fullness as she often has in the past with BV, no diarrhea as she has sometimes had with BV.  Feels 50% better from prior but wanted to make sure nothing was going on before her trip. Going to [place] to see boyfriend Pamela Carr in 2 wks.  Past Medical History  Diagnosis Date  . Vaginal yeast infection   . UTI (urinary tract infection)    Current Outpatient Prescriptions on File Prior to Visit  Medication Sig Dispense Refill  . cetirizine (ZYRTEC) 10 MG tablet Take 1 tablet (10 mg total) by mouth daily.  30 tablet  5  . Multiple Vitamin (MULTIVITAMIN) tablet Take 1 tablet by mouth daily.      . norethindrone-ethinyl estradiol (LOESTRIN 1/20, 21,) 1-20 MG-MCG tablet Take 1 tablet by mouth daily.  1 Package  11  . azelastine (OPTIVAR) 0.05 % ophthalmic solution Place 1 drop into both eyes 2 (two) times daily.  6 mL  3  . fluticasone (FLONASE) 50 MCG/ACT nasal spray Place 2 sprays into the nose daily.  16 g  5  . metroNIDAZOLE (FLAGYL) 500 MG tablet Take 1 tablet (500 mg total) by mouth 2 (two) times daily.  14 tablet  0  . ranitidine (ZANTAC) 150 MG tablet Take 150 mg by mouth as needed.       No current facility-administered medications on file prior to visit.   No Known Allergies  Review of Systems  Constitutional: Negative for fever, chills, diaphoresis, activity change, appetite change, fatigue and unexpected weight change.   Gastrointestinal: Negative for abdominal pain, diarrhea, constipation, blood in stool, anal bleeding and rectal pain.  Genitourinary: Positive for dysuria, vaginal discharge and pelvic pain. Negative for urgency, frequency, hematuria, decreased urine volume, vaginal bleeding, difficulty urinating, genital sores, vaginal pain, menstrual problem and dyspareunia.  Musculoskeletal: Negative for gait problem.  Skin: Negative for rash.  Hematological: Negative for adenopathy.  Psychiatric/Behavioral: The patient is not nervous/anxious.       BP 98/62  Pulse 80  Temp(Src) 98.1 F (36.7 C) (Oral)  Resp 20  Ht 5\' 8"  (1.727 m)  Wt 117 lb 12.8 oz (53.434 kg)  BMI 17.92 kg/m2  LMP 10/24/2012 Objective:   Physical Exam  Constitutional: She is oriented to person, place, and time. She appears well-developed and well-nourished. No distress.  HENT:  Head: Normocephalic and atraumatic.  Right Ear: External ear normal.  Eyes: Conjunctivae are normal. No scleral icterus.  Pulmonary/Chest: Effort normal.  Genitourinary: There is no rash, tenderness, lesion or injury on the right labia. There is no rash, tenderness, lesion or injury on the left labia. Cervix exhibits no discharge and no friability. No erythema, tenderness or bleeding around the vagina. No foreign body around the vagina. No signs of injury around the vagina. Vaginal discharge found.  Small amount of mucousy yellow white thick discharge in vaginal vault.  Neurological: She is alert  and oriented to person, place, and time.  Skin: Skin is warm and dry. She is not diaphoretic. No erythema.  Psychiatric: She has a normal mood and affect. Her behavior is normal.           Results for orders placed in visit on 12/01/12  POCT URINALYSIS DIPSTICK      Result Value Range   Color, UA orange     Clarity, UA clear     Glucose, UA neg     Bilirubin, UA neg     Ketones, UA trace     Spec Grav, UA 1.025     Blood, UA neg     pH, UA 5.5      Protein, UA neg     Urobilinogen, UA 0.2     Nitrite, UA neg     Leukocytes, UA Negative    POCT UA - MICROSCOPIC ONLY      Result Value Range   WBC, Ur, HPF, POC 1-4     RBC, urine, microscopic 0-2     Bacteria, U Microscopic trace     Mucus, UA large     Epithelial cells, urine per micros 0-3     Crystals, Ur, HPF, POC neg     Casts, Ur, LPF, POC neg     Yeast, UA neg    POCT WET PREP WITH KOH      Result Value Range   Trichomonas, UA Negative     Clue Cells Wet Prep HPF POC 5-10     Epithelial Wet Prep HPF POC 10-25     Yeast Wet Prep HPF POC neg     Bacteria Wet Prep HPF POC 4+     RBC Wet Prep HPF POC 0-1     WBC Wet Prep HPF POC 3-8     KOH Prep POC Negative      Assessment & Plan:  Dysuria - Plan: POCT urinalysis dipstick, POCT UA - Microscopic Only  Vaginitis and vulvovaginitis, unspecified - Plan: POCT Wet Prep with KOH  BV - Pt has had recurrent BV infections every 2 wks x 3 now - initially treated with metronidazole 500 bid x 7d, 1 wk later her infection was worse. Then followed by tindazole 2g qd x 2d and 2 wks later her infection is better but still present.  Therefore will repeat tindazole followed by a course of metrogel qhs x 5d.  Following this she will start prn boric acid suppositories. Next mo, she will try the new OCP she was prescribed at last OV with lower dose hormones to hopefully help w/ improvement. Frustrating for Jo as she is being hypervigilant about BV risk factors/etiologies and is not currently sexually active - yet still having recurrent problems.  Knows to expect some physiologic discharge with the vaginal suppository treatments and is ok with this.  Meds ordered this encounter  Medications  . AMBULATORY NON FORMULARY MEDICATION    Sig: Boric Acid Suppository 600 mg Insert 1 PV twice weekly to maintain vaginal flora    Dispense:  30 suppository    Refill:  5  . tinidazole (TINDAMAX) 500 MG tablet    Sig: Take 4 tablets (2,000 mg total)  by mouth daily with breakfast.    Dispense:  8 tablet    Refill:  0  . metroNIDAZOLE (METROGEL VAGINAL) 0.75 % vaginal gel    Sig: Place 1 Applicatorful vaginally at bedtime.    Dispense:  70 g    Refill:  0

## 2012-12-15 ENCOUNTER — Encounter: Payer: Self-pay | Admitting: Family Medicine

## 2012-12-16 ENCOUNTER — Encounter: Payer: Self-pay | Admitting: Family Medicine

## 2012-12-18 MED ORDER — FLUCONAZOLE 150 MG PO TABS: 150.0000 mg | ORAL_TABLET | Freq: Once | ORAL | Status: DC

## 2013-01-25 ENCOUNTER — Encounter: Payer: Self-pay | Admitting: Family Medicine

## 2013-01-25 DIAGNOSIS — B9689 Other specified bacterial agents as the cause of diseases classified elsewhere: Secondary | ICD-10-CM

## 2013-01-26 MED ORDER — METRONIDAZOLE 500 MG PO TABS: 500.0000 mg | ORAL_TABLET | Freq: Two times a day (BID) | ORAL | Status: DC

## 2013-02-03 ENCOUNTER — Other Ambulatory Visit: Payer: Self-pay

## 2013-03-16 ENCOUNTER — Encounter: Payer: Self-pay | Admitting: Family Medicine

## 2013-03-22 ENCOUNTER — Ambulatory Visit (INDEPENDENT_AMBULATORY_CARE_PROVIDER_SITE_OTHER): Payer: BC Managed Care – PPO | Admitting: Family Medicine

## 2013-03-22 VITALS — BP 110/70 | HR 91 | Temp 97.7°F | Resp 16 | Ht 62.5 in | Wt 116.0 lb

## 2013-03-22 DIAGNOSIS — N912 Amenorrhea, unspecified: Secondary | ICD-10-CM

## 2013-03-22 DIAGNOSIS — N760 Acute vaginitis: Secondary | ICD-10-CM

## 2013-03-22 DIAGNOSIS — B9689 Other specified bacterial agents as the cause of diseases classified elsewhere: Secondary | ICD-10-CM

## 2013-03-22 DIAGNOSIS — R7989 Other specified abnormal findings of blood chemistry: Secondary | ICD-10-CM

## 2013-03-22 DIAGNOSIS — J040 Acute laryngitis: Secondary | ICD-10-CM

## 2013-03-22 DIAGNOSIS — N762 Acute vulvitis: Secondary | ICD-10-CM

## 2013-03-22 LAB — POCT WET PREP WITH KOH
Epithelial Wet Prep HPF POC: NEGATIVE
KOH Prep POC: NEGATIVE
RBC Wet Prep HPF POC: NEGATIVE
Trichomonas, UA: NEGATIVE
Yeast Wet Prep HPF POC: NEGATIVE

## 2013-03-22 LAB — POCT CBC
Lymph, poc: 1.6 (ref 0.6–3.4)
MCH, POC: 29.6 pg (ref 27–31.2)
MCHC: 32.3 g/dL (ref 31.8–35.4)
MCV: 91.7 fL (ref 80–97)
MID (cbc): 0.6 (ref 0–0.9)
MPV: 8.9 fL (ref 0–99.8)
POC LYMPH PERCENT: 16.1 %L (ref 10–50)
POC MID %: 6.4 %M (ref 0–12)
Platelet Count, POC: 188 10*3/uL (ref 142–424)
RBC: 4.56 M/uL (ref 4.04–5.48)
RDW, POC: 13.4 %
WBC: 9.8 10*3/uL (ref 4.6–10.2)

## 2013-03-22 LAB — COMPREHENSIVE METABOLIC PANEL
ALT: 14 U/L (ref 0–35)
AST: 16 U/L (ref 0–37)
Alkaline Phosphatase: 67 U/L (ref 39–117)
BUN: 5 mg/dL — ABNORMAL LOW (ref 6–23)
Calcium: 8.9 mg/dL (ref 8.4–10.5)
Creat: 0.67 mg/dL (ref 0.50–1.10)
Total Bilirubin: 0.3 mg/dL (ref 0.3–1.2)

## 2013-03-22 LAB — POCT RAPID STREP A (OFFICE): Rapid Strep A Screen: NEGATIVE

## 2013-03-22 MED ORDER — TINIDAZOLE 500 MG PO TABS: 2.0000 g | ORAL_TABLET | Freq: Every day | ORAL | Status: DC

## 2013-03-22 MED ORDER — METRONIDAZOLE 500 MG PO TABS: 500.0000 mg | ORAL_TABLET | Freq: Two times a day (BID) | ORAL | Status: DC

## 2013-03-22 MED ORDER — HYDROCODONE-ACETAMINOPHEN 7.5-325 MG/15ML PO SOLN
10.0000 mL | Freq: Four times a day (QID) | ORAL | Status: DC | PRN
Start: 2013-03-22 — End: 2013-06-06

## 2013-03-22 NOTE — Patient Instructions (Signed)
If your symptoms continue, laryngitis can respond really well to a course of steroids.  However, we will hold off at this point as they can suppress your immune system. However, if you want to  start them the day before or of when cody comes home that could help symptomatically - hopefully you will be better by then though.  Laryngitis At the top of your windpipe is your voice box. It is the source of your voice. Inside your voice box are 2 bands of muscles called vocal cords. When you breathe, your vocal cords are relaxed and open so that air can get into the lungs. When you decide to say something, these cords come together and vibrate. The sound from these vibrations goes into your throat and comes out through your mouth as sound. Laryngitis is an inflammation of the vocal cords that causes hoarseness, cough, loss of voice, sore throat, and dry throat. Laryngitis can be temporary (acute) or long-term (chronic). Most cases of acute laryngitis improve with time.Chronic laryngitis lasts for more than 3 weeks. CAUSES Laryngitis can often be related to excessive smoking, talking, or yelling, as well as inhalation of toxic fumes and allergies. Acute laryngitis is usually caused by a viral infection, vocal strain, measles or mumps, or bacterial infections. Chronic laryngitis is usually caused by vocal cord strain, vocal cord injury, postnasal drip, growths on the vocal cords, or acid reflux. SYMPTOMS   Cough.  Sore throat.  Dry throat. RISK FACTORS  Respiratory infections.  Exposure to irritating substances, such as cigarette smoke, excessive amounts of alcohol, stomach acids, and workplace chemicals.  Voice trauma, such as vocal cord injury from shouting or speaking too loud. DIAGNOSIS  Your cargiver will perform a physical exam. During the physical exam, your caregiver will examine your throat. The most common sign of laryngitis is hoarseness. Laryngoscopy may be necessary to confirm the  diagnosis of this condition. This procedure allows your caregiver to look into the larynx. HOME CARE INSTRUCTIONS  Drink enough fluids to keep your urine clear or pale yellow.  Rest until you no longer have symptoms or as directed by your caregiver.  Breathe in moist air.  Take all medicine as directed by your caregiver.  Do not smoke.  Talk as little as possible (this includes whispering).  Write on paper instead of talking until your voice is back to normal.  Follow up with your caregiver if your condition has not improved after 10 days. SEEK MEDICAL CARE IF:   You have trouble breathing.  You cough up blood.  You have persistent fever.  You have increasing pain.  You have difficulty swallowing. MAKE SURE YOU:  Understand these instructions.  Will watch your condition.  Will get help right away if you are not doing well or get worse. Document Released: 03/17/2005 Document Revised: 06/09/2011 Document Reviewed: 05/23/2010 [facility] Patient Information 2014 [place], [place].

## 2013-03-22 NOTE — Progress Notes (Signed)
Subjective:  This chart was scribed for Pamela Carr. Pamela Croft, MD,  by Pamela Carr, ED Scribe. The patient was seen in room 3 and the patient's care was started at 10:36 AM.  Chief Complaint  Patient presents with  . Sore Throat    x 3 days  . Vaginal Discharge    check for yeast     Patient ID: Pamela Carr, female    DOB: December 30, 1990, 23 y.o.   MRN: [id number]  Sore Throat  Pertinent negatives include no abdominal pain, congestion, coughing, diarrhea, ear pain, shortness of breath or trouble swallowing.  Vaginal Discharge The patient's primary symptoms include a vaginal discharge. The patient's pertinent negatives include no pelvic pain. Associated symptoms include a sore throat. Pertinent negatives include no abdominal pain, chills, constipation, diarrhea or fever.   HPI Comments: Pamela Carr is a 22 y.o. female who presents to [facility] complaining of constant, moderate sore throat for the past three days. She had the associated symptoms of fatigue, general body aches, fever, and rhinorrhea that diminished yesterday. However today she reports her sore throat is much more painful today. She is experiencing vocal changes. Pt has tried ginger tea, Zicam, and salt water gargles. She also had some magic mouthwash left over from when she had mono earlier this yr which she has been using.  Her systemic sxs are resolving and she is def feeling better though her pharyngitis/laryngitis persists.  Her main concern is that her boyfriend, Pamela Carr, who is in the [place] is coming home for 9d - will arrive in 4d and he can't get sick so stated he can't see her until she is better. She is hoping he is going to propose over xmas (and if not then then very soon) so very excited and wants to get her voice back to nml asap.  Pt also complains of ongoing vaginal discharge. She was treated for a yeast infection last week w/ diflucan x 2 and she is requesting a re-check today. She could not get in w/ me last Fri and I  did not work over the wkend so she went to go see her prev PCP at home when she visited her parents last wkend.  Pt visited her Endocrinologist yesterday and he stated that her thyroid nodule is completely unchanged over the past 4 yrs and so appears to benign.  Labs were not done but [place] was stable. He advised to recheck in 1 addtnl yr and if nodule still unchanged at that time, no further f/u needed.  She is on OCPs and did have have a period this past mo. She has not had sex since Oct when she visited her boyfriend in [place] and did a home UPT and one at her dr's office last wkend - both of which were negative.  Prior, her periods had been very light. When she was in high school, before starting on OCPs her periods were irreg and she would sometimes skip months. She is concerned this could mean that she will have future prob w/ fertility. She has unintentionally lost about 10 lbs in the past sev mos since she was switched to low dose OCPs - back to her high school weight.  No stress or anxiety - is doing really well in life now, very happy w/ job and boyfriend.   Past Medical History  Diagnosis Date  . Vaginal yeast infection   . UTI (urinary tract infection)    Current Outpatient Prescriptions on File Prior to Visit  Medication  Sig Dispense Refill  . AMBULATORY NON FORMULARY MEDICATION Boric Acid Suppository 600 mg Insert 1 PV twice weekly to maintain vaginal flora  30 suppository  5  . Multiple Vitamin (MULTIVITAMIN) tablet Take 1 tablet by mouth daily.      . norethindrone-ethinyl estradiol (LOESTRIN 1/20, 21,) 1-20 MG-MCG tablet Take 1 tablet by mouth daily.  1 Package  11  . ranitidine (ZANTAC) 150 MG tablet Take 150 mg by mouth as needed.      Marland Kitchen azelastine (OPTIVAR) 0.05 % ophthalmic solution Place 1 drop into both eyes 2 (two) times daily.  6 mL  3  . fluticasone (FLONASE) 50 MCG/ACT nasal spray Place 2 sprays into the nose daily.  16 g  5  . tinidazole (TINDAMAX) 500 MG tablet Take 4  tablets (2,000 mg total) by mouth daily with breakfast.  8 tablet  0   No current facility-administered medications on file prior to visit.   No Known Allergies  She does not smoke. She does not have any known allergies to medications.   Review of Systems  Constitutional: Positive for unexpected weight change. Negative for fever, chills, diaphoresis, activity change, appetite change and fatigue.  HENT: Positive for rhinorrhea, sore throat and voice change. Negative for congestion, dental problem, ear pain, facial swelling, postnasal drip, sinus pressure, sneezing and trouble swallowing.   Respiratory: Negative for cough, shortness of breath and wheezing.   Cardiovascular: Negative for chest pain and palpitations.  Gastrointestinal: Negative for abdominal pain, diarrhea and constipation.  Genitourinary: Positive for vaginal discharge and menstrual problem. Negative for vaginal bleeding, vaginal pain and pelvic pain.  Musculoskeletal: Negative for arthralgias and myalgias.  Hematological: Positive for adenopathy.  Psychiatric/Behavioral: Positive for sleep disturbance. Negative for behavioral problems and dysphoric mood. The patient is not nervous/anxious.       BP 110/70  Pulse 91  Temp(Src) 97.7 F (36.5 C) (Oral)  Resp 16  Ht 5' 2.5" (1.588 m)  Wt 116 lb (52.617 kg)  BMI 20.87 kg/m2  SpO2 100%  LMP 03/15/2013  Objective:   Physical Exam  Nursing note and vitals reviewed. Constitutional: She is oriented to person, place, and time. She appears well-developed and well-nourished. No distress.  HENT:  Head: Normocephalic and atraumatic.  Right Ear: Tympanic membrane is retracted. Tympanic membrane is not injected, not erythematous and not bulging. A middle ear effusion is present.  Left Ear: Tympanic membrane is retracted. Tympanic membrane is not injected, not erythematous and not bulging. A middle ear effusion is present.  Nose: Mucosal edema and rhinorrhea present.    Mouth/Throat: Uvula is midline and mucous membranes are normal. No trismus in the jaw. No uvula swelling. Oropharyngeal exudate and posterior oropharyngeal erythema present. No posterior oropharyngeal edema.  1+ tonsil Mild oropharyngeal erythema Small amount of exudate on left tonsil Hoarse, raspy voice  Eyes: EOM are normal. Pupils are equal, round, and reactive to light.  Neck: Normal range of motion and full passive range of motion without pain. Neck supple. No rigidity. No tracheal deviation and no erythema present. No mass and no thyromegaly present.  Cardiovascular: Normal rate.   Pulmonary/Chest: Effort normal. No respiratory distress.  Abdominal: Soft. She exhibits no distension.  Musculoskeletal: Normal range of motion.  Lymphadenopathy:       Head (right side): Tonsillar adenopathy present. No submandibular, no preauricular, no posterior auricular and no occipital adenopathy present.       Head (left side): Tonsillar adenopathy present. No submandibular, no preauricular, no posterior auricular and  no occipital adenopathy present.    She has cervical adenopathy (Lt>Rt).       Right cervical: Superficial cervical and posterior cervical adenopathy present.       Left cervical: Superficial cervical and posterior cervical adenopathy present.       Right: No supraclavicular adenopathy present.       Left: No supraclavicular adenopathy present.  Neurological: She is alert and oriented to person, place, and time.  Skin: Skin is warm and dry.  Psychiatric: She has a normal mood and affect. Her behavior is normal.   Results for orders placed in visit on 03/22/13  COMPREHENSIVE METABOLIC PANEL      Result Value Range   Sodium 138  135 - 145 mEq/L   Potassium 4.0  3.5 - 5.3 mEq/L   Chloride 103  96 - 112 mEq/L   CO2 23  19 - 32 mEq/L   Glucose, Bld 75  70 - 99 mg/dL   BUN 5 (*) 6 - 23 mg/dL   Creat 4.09  8.11 - 9.14 mg/dL   Total Bilirubin 0.3  0.3 - 1.2 mg/dL   Alkaline  Phosphatase 67  39 - 117 U/L   AST 16  0 - 37 U/L   ALT 14  0 - 35 U/L   Total Protein 6.5  6.0 - 8.3 g/dL   Albumin 3.8  3.5 - 5.2 g/dL   Calcium 8.9  8.4 - 78.2 mg/dL  TSH      Result Value Range   TSH 3.566  0.350 - 4.500 uIU/mL  POCT CBC      Result Value Range   WBC 9.8  4.6 - 10.2 K/uL   Lymph, poc 1.6  0.6 - 3.4   POC LYMPH PERCENT 16.1  10 - 50 %L   MID (cbc) 0.6  0 - 0.9   POC MID % 6.4  0 - 12 %M   POC Granulocyte 7.6 (*) 2 - 6.9   Granulocyte percent 77.5  37 - 80 %G   RBC 4.56  4.04 - 5.48 M/uL   Hemoglobin 13.5  12.2 - 16.2 g/dL   HCT, POC 95.6  21.3 - 47.9 %   MCV 91.7  80 - 97 fL   MCH, POC 29.6  27 - 31.2 pg   MCHC 32.3  31.8 - 35.4 g/dL   RDW, POC 08.6     Platelet Count, POC 188  142 - 424 K/uL   MPV 8.9  0 - 99.8 fL  POCT RAPID STREP A (OFFICE)      Result Value Range   Rapid Strep A Screen Negative  Negative  POCT WET PREP WITH KOH      Result Value Range   Trichomonas, UA Negative     Clue Cells Wet Prep HPF POC tntc     Epithelial Wet Prep HPF POC neg     Yeast Wet Prep HPF POC neg     Bacteria Wet Prep HPF POC 4+     RBC Wet Prep HPF POC neg     WBC Wet Prep HPF POC tntc     KOH Prep POC Negative       Assessment & Plan:  Vulvitis - Plan: TSH, POCT Wet Prep with KOH  Laryngitis - Plan: POCT CBC, POCT rapid strep A, Culture, Group A Strep - Discussed course of care with pt which includes continue Ibuprofen, Zycam, Magic Mouthwash, ginger tea, and cold-eez. If symptoms progress or do not resolve over  the next 48 hrs advised pt to call/email and I will send a rx for prednisone burst. Pt understands and agrees.  Amenorrhea due to oral contraceptive - Plan: TSH, POCT Wet Prep with KOH - suspect benign etiology - pt reassurance provided.   Elevated LFTs - Plan: Comprehensive metabolic panel - elev during prior mono illness - recheck  Bacterial vaginosis - will do tindamax rather than flagyl due to shorter course to try to trx prior to the holidays  and visit from boyfriend.  Reminded pt that if she does end up needing antibiotic or prednisone due to URI sxs she will likely need a diflucan following and ok to call in.  Meds ordered this encounter  Medications  . DISCONTD: metroNIDAZOLE (FLAGYL) 500 MG tablet    Sig: Take 1 tablet (500 mg total) by mouth 2 (two) times daily with a meal. DO NOT CONSUME ALCOHOL WHILE TAKING THIS MEDICATION.    Dispense:  14 tablet    Refill:  0  . tinidazole (TINDAMAX) 500 MG tablet    Sig: Take 4 tablets (2,000 mg total) by mouth daily with breakfast.    Dispense:  8 tablet    Refill:  0  . HYDROcodone-acetaminophen (HYCET) 7.5-325 mg/15 ml solution    Sig: Take 10-15 mLs by mouth every 6 (six) hours as needed.    Dispense:  140 mL    Refill:  0    I personally performed the services described in this documentation, which was scribed in my presence. The recorded information has been reviewed and considered, and addended by me as needed.  Norberto Sorenson, MD MPH

## 2013-03-25 ENCOUNTER — Encounter: Payer: Self-pay | Admitting: Family Medicine

## 2013-04-29 ENCOUNTER — Ambulatory Visit: Payer: BC Managed Care – PPO | Admitting: Family Medicine

## 2013-05-03 ENCOUNTER — Ambulatory Visit: Payer: BC Managed Care – PPO | Admitting: Family Medicine

## 2013-06-06 ENCOUNTER — Ambulatory Visit (INDEPENDENT_AMBULATORY_CARE_PROVIDER_SITE_OTHER): Payer: BC Managed Care – PPO | Admitting: Family Medicine

## 2013-06-06 ENCOUNTER — Encounter: Payer: Self-pay | Admitting: Family Medicine

## 2013-06-06 VITALS — BP 110/74 | HR 94 | Temp 98.5°F | Resp 18 | Ht 63.0 in | Wt 118.0 lb

## 2013-06-06 DIAGNOSIS — B9689 Other specified bacterial agents as the cause of diseases classified elsewhere: Secondary | ICD-10-CM

## 2013-06-06 DIAGNOSIS — N9489 Other specified conditions associated with female genital organs and menstrual cycle: Secondary | ICD-10-CM

## 2013-06-06 DIAGNOSIS — N760 Acute vaginitis: Secondary | ICD-10-CM

## 2013-06-06 DIAGNOSIS — N949 Unspecified condition associated with female genital organs and menstrual cycle: Secondary | ICD-10-CM

## 2013-06-06 LAB — POCT URINE PREGNANCY: Preg Test, Ur: NEGATIVE

## 2013-06-06 LAB — POCT WET PREP WITH KOH
KOH PREP POC: NEGATIVE
TRICHOMONAS UA: NEGATIVE
YEAST WET PREP PER HPF POC: NEGATIVE

## 2013-06-06 LAB — POCT UA - MICROSCOPIC ONLY
CASTS, UR, LPF, POC: NEGATIVE
CRYSTALS, UR, HPF, POC: NEGATIVE
Epithelial cells, urine per micros: NEGATIVE
MUCUS UA: POSITIVE
Yeast, UA: NEGATIVE

## 2013-06-06 LAB — POCT URINALYSIS DIPSTICK
Bilirubin, UA: NEGATIVE
GLUCOSE UA: NEGATIVE
Ketones, UA: NEGATIVE
Leukocytes, UA: NEGATIVE
Nitrite, UA: NEGATIVE
Protein, UA: NEGATIVE
RBC UA: NEGATIVE
UROBILINOGEN UA: 0.2
pH, UA: 5.5

## 2013-06-06 MED ORDER — FLUCONAZOLE 150 MG PO TABS: 150.0000 mg | ORAL_TABLET | Freq: Once | ORAL | Status: DC

## 2013-06-06 MED ORDER — TINIDAZOLE 500 MG PO TABS: 2.0000 g | ORAL_TABLET | Freq: Once | ORAL | Status: DC

## 2013-06-06 NOTE — Progress Notes (Signed)
[facility] [address], [place] [id number] [phone]  Date:  06/06/2013   Name:  Pamela PenmanLauren Carr   DOB:  [birth year]   MRN:  [id number]  PCP:  Norberto SorensonSHAW,EVA, MD    Chief Complaint: vaginal burning and Vaginal Itching   History of Present Illness:  Pamela Carr is a 23 y.o. very pleasant female patient who presents with the following:  Here today to follow-up a vaginal complaint.  In December she was noted to have BV and wonders if she has this again.  She has some vaginal burning- this is not that unusual for her.  She does want to make sure everything is ok as she is leaving town soon.  [place] is moving to [place] soon for a new job with her fiance.   She notes that urine stings her vulva when she urinates, but otherwise she does not have dysuria.    Pap last summer, looked good.  She had a negative Cg/CMZ.   She has been with her fiance for 2 years now- she does not think there is risk of STI.    LMP about one month ago- she is on OCP.  She is doing well with this.    Patient Active Problem List   Diagnosis Date Noted  . Vaginal candidiasis 03/18/2012  . Anxiety 08/27/2011  . Dyspareunia 07/30/2011  . Vulvitis 07/30/2011  . THYROID NODULE, RIGHT 12/07/2009    Past Medical History  Diagnosis Date  . Vaginal yeast infection   . UTI (urinary tract infection)     Past Surgical History  Procedure Laterality Date  . Fracture surgery    . Wisdom teeth extraction      History  Substance Use Topics  . Smoking status: Never Smoker   . Smokeless tobacco: Never Used  . Alcohol Use: No     Comment: 2 x per month    Family History  Problem Relation Age of Onset  . Allergies Father   . Hypertension Maternal Grandmother   . Asthma Maternal Grandfather   . Allergies Maternal Grandfather   . Arthritis Paternal Grandmother     rheumatoid    No Known Allergies  Medication list has been reviewed and updated.  Current Outpatient Prescriptions on  File Prior to Visit  Medication Sig Dispense Refill  . AMBULATORY NON FORMULARY MEDICATION Boric Acid Suppository 600 mg Insert 1 PV twice weekly to maintain vaginal flora  30 suppository  5  . Multiple Vitamin (MULTIVITAMIN) tablet Take 1 tablet by mouth daily.      . norethindrone-ethinyl estradiol (LOESTRIN 1/20, 21,) 1-20 MG-MCG tablet Take 1 tablet by mouth daily.  1 Package  11  . ranitidine (ZANTAC) 150 MG tablet Take 150 mg by mouth as needed.       No current facility-administered medications on file prior to visit.    Review of Systems:  As per HPI- otherwise negative.   Physical Examination: Filed Vitals:   06/06/13 1134  BP: 110/74  Pulse: 94  Temp: 98.5 F (36.9 C)  Resp: 18   Filed Vitals:   06/06/13 1134  Height: 5\' 3"  (1.6 m)  Weight: 118 lb (53.524 kg)   Body mass index is 20.91 kg/(m^2). Ideal Body Weight: Weight in (lb) to have BMI = 25: 140.8  GEN: WDWN, NAD, Non-toxic, A & O x 3, looks well HEENT: Atraumatic, Normocephalic. Neck supple. No masses, No LAD. Ears and Nose: No external deformity. CV: RRR, No M/G/R. No  JVD. No thrill. No extra heart sounds. PULM: CTA B, no wheezes, crackles, rhonchi. No retractions. No resp. distress. No accessory muscle use. ABD: S, NT, ND, +BS. No rebound. No HSM. EXTR: No c/c/e NEURO Normal gait.  PSYCH: Normally interactive. Conversant. Not depressed or anxious appearing.  Calm demeanor.  GU: normal exam, no adnexal masses or tenderness, no CMT, no vaginal discharge or lesion  Results for orders placed in visit on 06/06/13  POCT WET PREP WITH KOH      Result Value Ref Range   Trichomonas, UA Negative     Clue Cells Wet Prep HPF POC tntc     Epithelial Wet Prep HPF POC tntc     Yeast Wet Prep HPF POC neg     Bacteria Wet Prep HPF POC 3+     RBC Wet Prep HPF POC 2-3     WBC Wet Prep HPF POC tntc     KOH Prep POC Negative    POCT UA - MICROSCOPIC ONLY      Result Value Ref Range   WBC, Ur, HPF, POC 2-3      RBC, urine, microscopic 3-5     Bacteria, U Microscopic 1+     Mucus, UA pos     Epithelial cells, urine per micros neg     Crystals, Ur, HPF, POC neg     Casts, Ur, LPF, POC neg     Yeast, UA neg    POCT URINALYSIS DIPSTICK      Result Value Ref Range   Color, UA yellow     Clarity, UA clear     Glucose, UA neg     Bilirubin, UA neg     Ketones, UA neg     Spec Grav, UA >=1.030     Blood, UA neg     pH, UA 5.5     Protein, UA neg     Urobilinogen, UA 0.2     Nitrite, UA neg     Leukocytes, UA Negative    POCT URINE PREGNANCY      Result Value Ref Range   Preg Test, Ur Negative      Pt is starting her period now  Assessment and Plan: Vaginal burning - Plan: POCT Wet Prep with KOH, POCT UA - Microscopic Only, POCT urinalysis dipstick, POCT urine pregnancy, Urine culture  BV (bacterial vaginosis) - Plan: tinidazole (TINDAMAX) 500 MG tablet, fluconazole (DIFLUCAN) 150 MG tablet  Treat for BV as above.   See patient instructions for more details.     Signed Abbe Amsterdam, MD

## 2013-06-06 NOTE — Patient Instructions (Signed)
Use the tinidazole as directed for BV.  Let us know if you have any other problems.  I will send your urine for a culture and will let you know if you do have a UTI.

## 2013-06-07 LAB — URINE CULTURE
Colony Count: NO GROWTH
ORGANISM ID, BACTERIA: NO GROWTH

## 2013-06-17 ENCOUNTER — Ambulatory Visit (INDEPENDENT_AMBULATORY_CARE_PROVIDER_SITE_OTHER): Payer: BC Managed Care – PPO | Admitting: Family Medicine

## 2013-06-17 ENCOUNTER — Encounter: Payer: Self-pay | Admitting: Family Medicine

## 2013-06-17 VITALS — BP 120/60 | HR 86 | Temp 98.6°F | Resp 16 | Ht 62.0 in | Wt 120.2 lb

## 2013-06-17 DIAGNOSIS — N898 Other specified noninflammatory disorders of vagina: Secondary | ICD-10-CM

## 2013-06-17 DIAGNOSIS — B9689 Other specified bacterial agents as the cause of diseases classified elsewhere: Secondary | ICD-10-CM

## 2013-06-17 DIAGNOSIS — N760 Acute vaginitis: Secondary | ICD-10-CM

## 2013-06-17 LAB — POCT WET PREP WITH KOH
KOH PREP POC: NEGATIVE
TRICHOMONAS UA: NEGATIVE
YEAST WET PREP PER HPF POC: NEGATIVE

## 2013-06-17 MED ORDER — AZITHROMYCIN 250 MG PO TABS: ORAL_TABLET | ORAL | Status: DC

## 2013-06-17 MED ORDER — CLINDAMYCIN PHOSPHATE (1 DOSE) 2 % VA CREA: 1.0000 | TOPICAL_CREAM | Freq: Two times a day (BID) | VAGINAL | Status: DC

## 2013-06-17 MED ORDER — FLUCONAZOLE 150 MG PO TABS: 150.0000 mg | ORAL_TABLET | Freq: Once | ORAL | Status: DC

## 2013-06-18 ENCOUNTER — Telehealth: Payer: Self-pay

## 2013-06-18 NOTE — Telephone Encounter (Signed)
Patient wanted to ask Dr. Audria NineMcpherson or someone clinical to advise and clarify on instructions on the vaginal cream she was given 06/17/13. She says there are 7 applicators and the instructions are not clear as to how many days? Please advise, she wants to be certain and she is currently doing this 2 a day but wants to know for how long or if she needs to reuse the applicators.   Thank you!   I told her Dr. Audria NineMcpherson returns Monday. But that maybe someone this weekend might be able to clarify if its in Dr. Angelyn PuntMcpherson's notes.   Best: [phone]

## 2013-06-19 NOTE — Progress Notes (Signed)
S: This 23 y.o. Cauc female is here for recheck/ resolution of BV infection (recurrence). She will be moving to [place] in a few weeks to join her husband who is stationed there  in the [employer]. She completed antibiotic prescribed on 06/06/13; feels like infection has resolved but still has some discharge. She has boric acid suppositories for use but has not needed them. Last menses was normal. She denies pelvic pain or dysuria.  Patient Active Problem List   Diagnosis Date Noted  . Vaginal candidiasis 03/18/2012  . Anxiety 08/27/2011  . Dyspareunia 07/30/2011  . Vulvitis 07/30/2011  . THYROID NODULE, RIGHT 12/07/2009   PMHx, Surg Hx, Soc and Fam Hx reviewed.  MEDICATIONS reconciled.  ROS: As per HPI.  O: Filed Vitals:   06/17/13 1525  BP: 120/60  Pulse: 86  Temp: 98.6 F (37 C)  Resp: 16   GEN: In NAD; WN,WD. HENT: [place]/AT; EOMI w/ clear conj/sclerae. Otherwise unremarkable. COR: RRR. LUNGS: unlabored resp. GU: NEFG; vaginal vault w/ scant discharge and normal cervix.  NEURO: A&O x 3; CNs intact. Nonfocal.  Results for orders placed in visit on 06/17/13  POCT WET PREP WITH KOH      Result Value Ref Range   Trichomonas, UA Negative     Clue Cells Wet Prep HPF POC 100%     Epithelial Wet Prep HPF POC 3-10     Yeast Wet Prep HPF POC neg     Bacteria Wet Prep HPF POC 3+     RBC Wet Prep HPF POC 0-2     WBC Wet Prep HPF POC 2-6     KOH Prep POC Negative      A/P: Vaginitis - Will treat BV as well as possible non C/GC cervicitis w/ Azithromycin. Plan: POCT Wet Prep with KOH  BV (bacterial vaginosis) - Plan: Clindamycin vagina cream followed by Diflucan for possible yeast infection.  Meds ordered this encounter  Medications  . Clindamycin Phosphate, 1 Dose, vaginal cream    Sig: Apply 1 Applicatorful topically 2 (two) times daily.    Dispense:  5.8 g    Refill:  0  . azithromycin (ZITHROMAX) 250 MG tablet    Sig: Take all tablets in same day (either as 1 dose or 2  tabs as 2 doses).    Dispense:  4 tablet    Refill:  0  . fluconazole (DIFLUCAN) 150 MG tablet    Sig: Take 1 tablet (150 mg total) by mouth once.    Dispense:  2 tablet    Refill:  0

## 2013-06-20 NOTE — Telephone Encounter (Signed)
She should use each applicator once at bedtime. This means she has 7 nights of treatment. I did not know that there would be individual applicators. She could return to the pharmacy where she purchased this medication and ask the pharmacist if this does not clarify dosingfor her.

## 2013-06-21 NOTE — Telephone Encounter (Signed)
I spoke w/ pt today and answered all her questions re: potential side effects of using the medication twice daily instead of once a day. Advised that she may have some vaginal irritation but some of vaginal cream comes out during the day. She needs to watch for potential GI upset, especially diarrhea. She is concerned that vaginal flora will become very abnormal or BV will return, worse than before. I reassured her that that is unlikely. She has Diflucan tablets to take; I advised she take 1 today and repeat in 1 week. After her next menstrual cycle, everything should return to normal state. She will return to clinic if needed.

## 2013-06-21 NOTE — Telephone Encounter (Signed)
Patient is very upset about not getting a phone call instantly.  She stated that she took the medicine twice daily as prescribed and now she is out of the medicine.  She wants to know if this will mess up her internal cycle.  Could you please advise.Marland Kitchen..Marland Kitchen

## 2013-06-29 ENCOUNTER — Ambulatory Visit (INDEPENDENT_AMBULATORY_CARE_PROVIDER_SITE_OTHER): Payer: BC Managed Care – PPO | Admitting: Family Medicine

## 2013-06-29 ENCOUNTER — Encounter: Payer: Self-pay | Admitting: Family Medicine

## 2013-06-29 VITALS — BP 98/58 | HR 86 | Temp 98.0°F | Resp 16 | Ht 63.0 in | Wt 121.6 lb

## 2013-06-29 DIAGNOSIS — R300 Dysuria: Secondary | ICD-10-CM

## 2013-06-29 DIAGNOSIS — N760 Acute vaginitis: Secondary | ICD-10-CM

## 2013-06-29 DIAGNOSIS — Z3041 Encounter for surveillance of contraceptive pills: Secondary | ICD-10-CM

## 2013-06-29 LAB — POCT URINALYSIS DIPSTICK
BILIRUBIN UA: NEGATIVE
GLUCOSE UA: NEGATIVE
Ketones, UA: NEGATIVE
Leukocytes, UA: NEGATIVE
Nitrite, UA: NEGATIVE
Protein, UA: NEGATIVE
RBC UA: NEGATIVE
UROBILINOGEN UA: 0.2
pH, UA: 5.5

## 2013-06-29 LAB — POCT WET PREP WITH KOH
KOH Prep POC: NEGATIVE
RBC Wet Prep HPF POC: NEGATIVE
Trichomonas, UA: NEGATIVE
YEAST WET PREP PER HPF POC: NEGATIVE

## 2013-06-29 MED ORDER — NORETHINDRONE ACET-ETHINYL EST 1-20 MG-MCG PO TABS: 1.0000 | ORAL_TABLET | Freq: Every day | ORAL | Status: DC

## 2013-06-29 NOTE — Progress Notes (Signed)
S:  This 23 y.o. Cauc female returns for recheck for resolution of BV infection. This has been a recurrent problem; last recurrence was treated w/ intravaginal cream. Miscommunication about administration of the medication resulted in pt using it twie daily. I discussed potential side effects w/ pt via telephone. Fortunately, she had no adverse effects and vaginal discharge has resolved. She requests 1 year refill on OCPs; she is moving to [facility] tomorrow and does not plan to get a PAP/pelvic exam for > 6 months. She will establish care w/ a PCP.   Patient Active Problem List   Diagnosis Date Noted  . Uses oral contraception 06/29/2013  . Vaginal candidiasis 03/18/2012  . Anxiety 08/27/2011  . Dyspareunia 07/30/2011  . Vulvitis 07/30/2011  . THYROID NODULE, RIGHT 12/07/2009   Prior to Admission medications   Medication Sig Start Date End Date Taking? Authorizing Provider  AMBULATORY NON FORMULARY MEDICATION Boric Acid Suppository 600 mg Insert 1 PV twice weekly to maintain vaginal flora 12/01/12  Yes Sherren Mocha, MD  norethindrone-ethinyl estradiol (LOESTRIN 1/20, 21,) 1-20 MG-MCG tablet Take 1 tablet by mouth daily.   Yes Maurice March, MD  ranitidine (ZANTAC) 150 MG tablet Take 150 mg by mouth as needed. 03/12/12  Yes Sherren Mocha, MD  azithromycin (ZITHROMAX) 250 MG tablet Take all tablets in same day (either as 1 dose or 2 tabs as 2 doses). 06/17/13   Maurice March, MD  Clindamycin Phosphate, 1 Dose, vaginal cream Apply 1 Applicatorful topically 2 (two) times daily. 06/17/13   Maurice March, MD  fluconazole (DIFLUCAN) 150 MG tablet Take 1 tablet (150 mg total) by mouth once. 06/17/13   Maurice March, MD  Multiple Vitamin (MULTIVITAMIN) tablet Take 1 tablet by mouth daily.    Historical Provider, MD   PMHx, Surg Hx, Soc and Fam Hx reviewed.  ROS; Noncontributory.  O: Filed Vitals:   06/29/13 1016  BP: 98/58  Pulse: 86  Temp: 98 F (36.7 C)  Resp: 16   GEN:  In NAD: WN,WD. HENT: [place]/AT; EOMI w/ clear conj/sclerae. Otherwise unremarkable. COR: RRR. LUNGS: Normal resp rate and effort. SKIN: W&D; intact w/o diaphoresis, erythema or pallor. GU: NEFG; normal vaginal mucosa w/ scant normal discharge. Cervix is normal w/o erythema, bleeding or lesions. Scant clear discharge present. NEURO: A&O x 3; CNs intact. Nonfocal.  Results for orders placed in visit on 06/29/13  POCT WET PREP WITH KOH      Result Value Ref Range   Trichomonas, UA Negative     Clue Cells Wet Prep HPF POC 0-2     Epithelial Wet Prep HPF POC 8-19     Yeast Wet Prep HPF POC neg     Bacteria Wet Prep HPF POC 2+     RBC Wet Prep HPF POC neg     WBC Wet Prep HPF POC 0-4     KOH Prep POC Negative    POCT URINALYSIS DIPSTICK      Result Value Ref Range   Color, UA yellow     Clarity, UA clear     Glucose, UA neg     Bilirubin, UA neg     Ketones, UA neg     Spec Grav, UA >=1.030     Blood, UA neg     pH, UA 5.5     Protein, UA neg     Urobilinogen, UA 0.2     Nitrite, UA neg     Leukocytes, UA  Negative     A/P: Uses oral contraception- Refill OCPs x 1 year. Encouraged establishment w/ PCP once settled in new home.  Vaginitis and vulvovaginitis, unspecified - BV resolved; pt reassured. Plan: POCT Wet Prep with KOH  Burning with urination - Plan: POCT urinalysis dipstick

## 2013-06-29 NOTE — Patient Instructions (Signed)
It appears that Bacterial Vaginosis has been resolved. Have a safe trip to [place] and enjoy the beautiful scenery.  Take care.Marland Kitchen..Marland Kitchen

## 2013-08-17 ENCOUNTER — Encounter: Payer: Self-pay | Admitting: Family Medicine

## 2013-08-26 MED ORDER — FLUCONAZOLE 150 MG PO TABS: 150.0000 mg | ORAL_TABLET | Freq: Once | ORAL | Status: AC

## 2013-08-26 MED ORDER — MUPIROCIN 2 % EX OINT: 1.0000 "application " | TOPICAL_OINTMENT | Freq: Three times a day (TID) | CUTANEOUS | Status: AC

## 2013-08-29 ENCOUNTER — Telehealth: Payer: Self-pay

## 2013-08-29 NOTE — Telephone Encounter (Signed)
Pt is waiting on prescriptions for Dr. Avelino Leeds, said she has spoken to chelle previously

## 2013-08-29 NOTE — Telephone Encounter (Signed)
Disregard bottom message (making notes) Pt called because she said that the [facility] in [place] never received her rx's. Called them in for her

## 2013-08-29 NOTE — Telephone Encounter (Signed)
Dr. Clelia Croft, Send in diflucan bactraban

## 2013-11-14 ENCOUNTER — Other Ambulatory Visit: Payer: Self-pay

## 2013-11-14 MED ORDER — NORETHINDRONE ACET-ETHINYL EST 1-20 MG-MCG PO TABS: 1.0000 | ORAL_TABLET | Freq: Every day | ORAL | Status: AC

## 2013-11-14 NOTE — Telephone Encounter (Signed)
Pt called to req RFs be sent to new pharm in [place] where she moved. Resent her remaining RFs and advised pt to be sure to est w/a new provider there.

## 2013-12-10 ENCOUNTER — Telehealth: Payer: Self-pay

## 2013-12-10 NOTE — Telephone Encounter (Addendum)
PATIENT WOULD LIKE THIS MESSAGE TO GO TO DR. SHAW: SHE STATES SHE HAS RECENTLY MOVED TO [place] BECAUSE SHE GOT MARRIED. SHE HAS NOT HAD A CHANCE TO FIND A PRIMARY CARE DOCTOR THERE YET. SHE SAID SHE GETS THESE VERY BAD YEAST INFECTIONS AND SHE HAS HAD BURNING AND ITCHING FOR ABOUT 4 DAYS. SHE WOULD LIKE TO KNOW IF THERE IS ANY WAY DR. SHAW WOULD CALL HER IN METRONIDAZOLE (VAGIONAL JELL) AND SOME DIFLUCAN? BEST PHONE [phone] (CELL)  [facility] [place] [phone]  (PATIENT'S MARRIED NAME NOW IS Simrit KEY)  MBC

## 2013-12-13 NOTE — Telephone Encounter (Signed)
LM for pt to refer to a local urgent care for her acute condition. Please rtn call if any further questions.

## 2014-05-10 IMAGING — CR DG ABDOMEN 1V
2 series · 2 of 2 positions shown · non-contrast
Comparison: None.

CLINICAL DATA: Left lower quadrant pain after exercising.

ABDOMEN - 1 VIEW

[AP (1 of 2)]
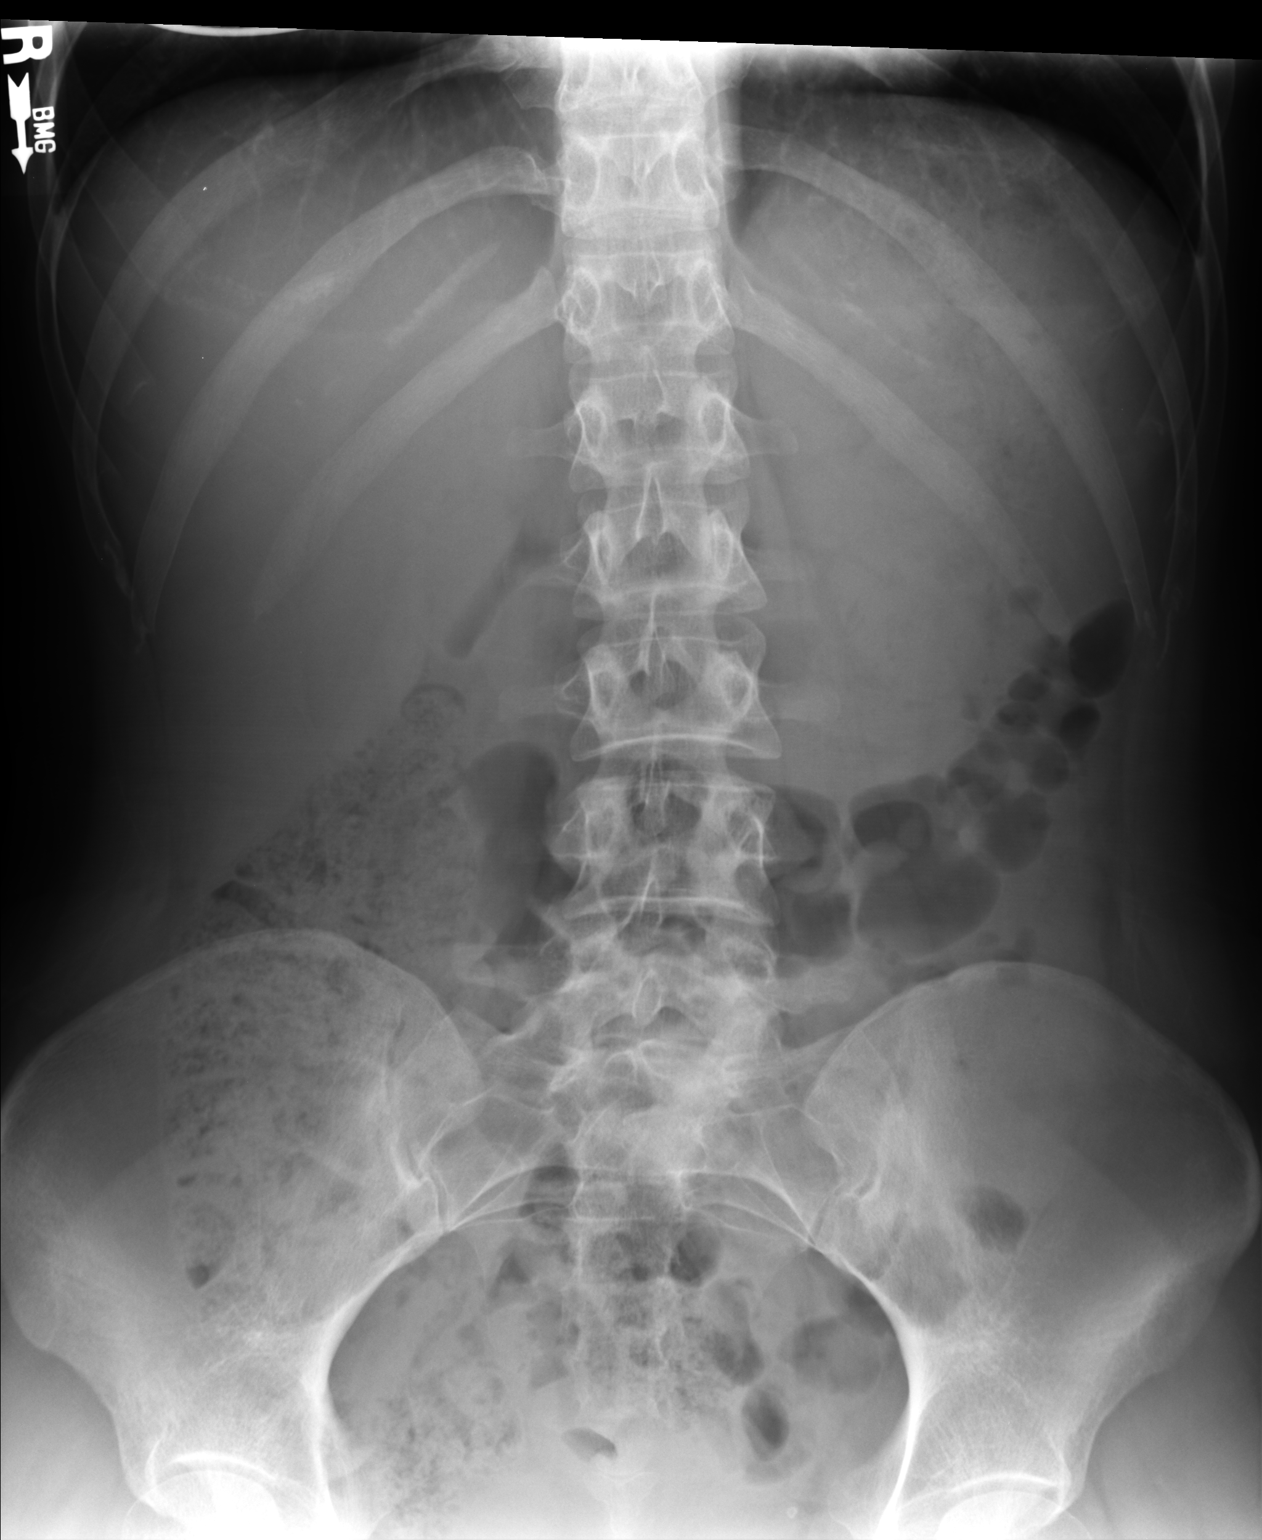

[AP (2 of 2)]
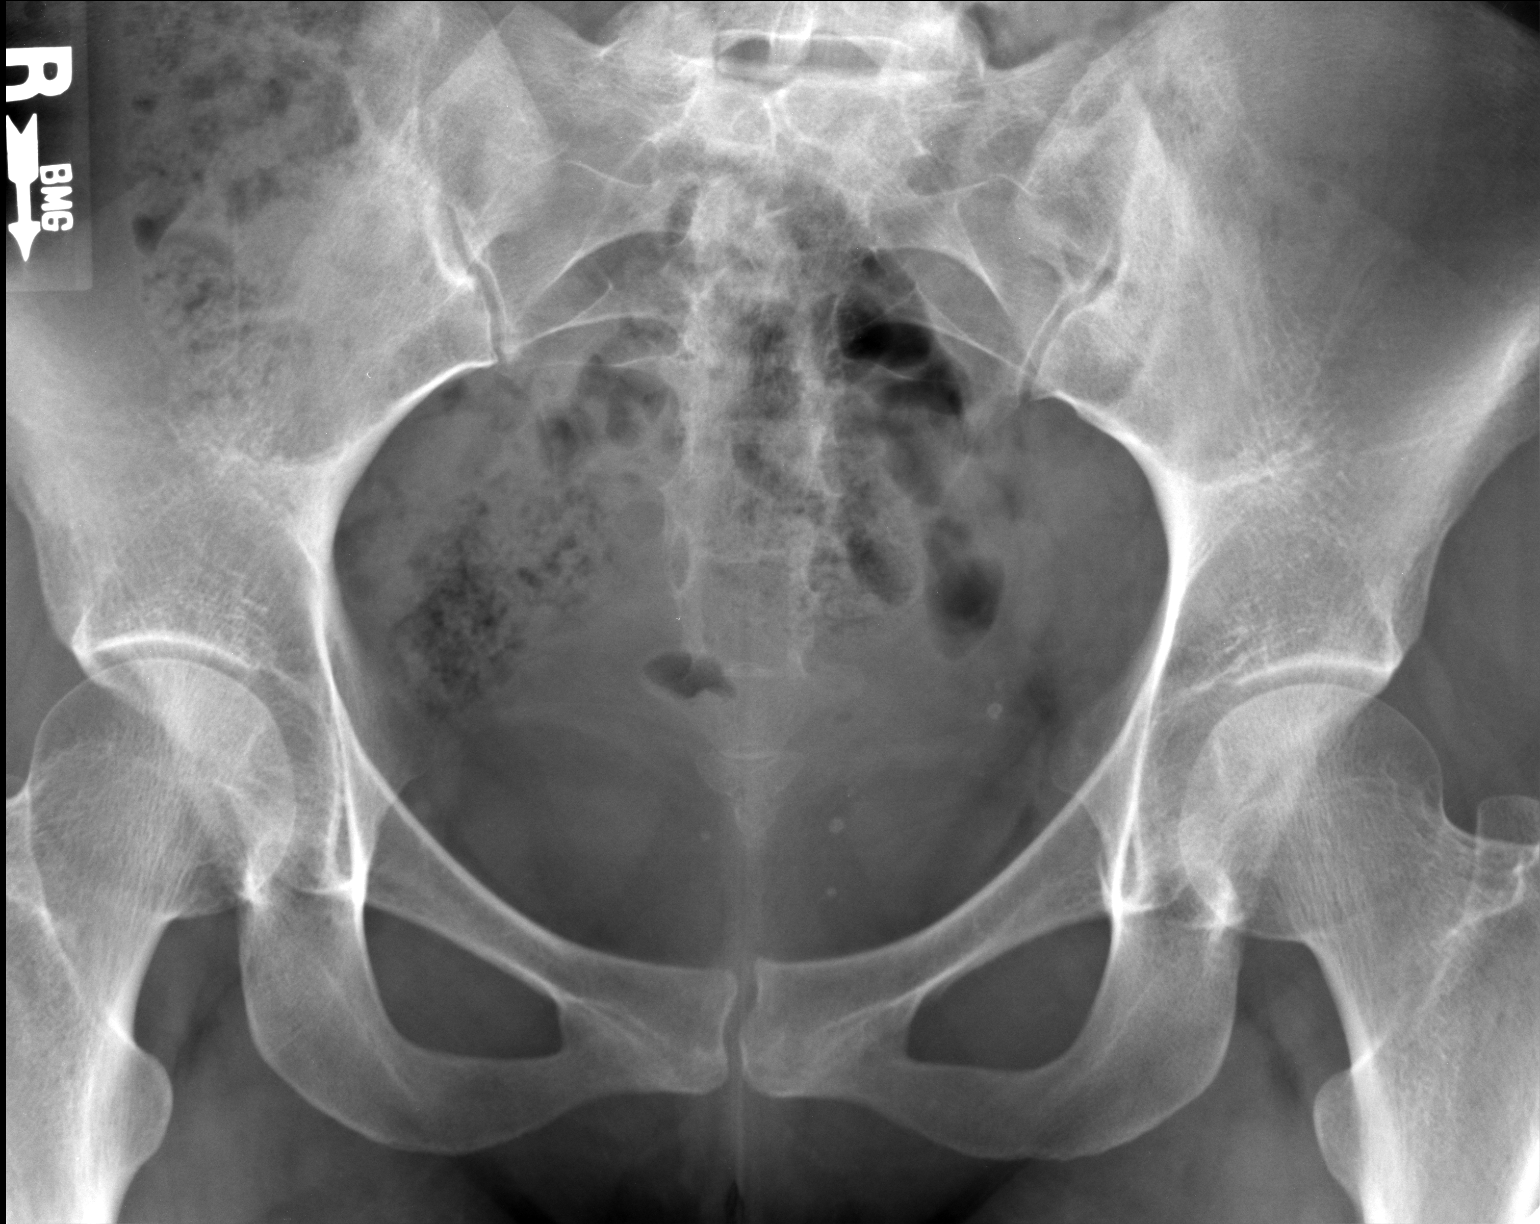

[2 of 2 positions shown; findings below may reference images not displayed]

FINDINGS: The bowel gas pattern is normal.  No radio-opaque calculi
or other significant radiographic abnormality is seen. Moderate
stool burden in the right colon.
IMPRESSION: No bowel obstruction or abnormal calcifications.
Moderate stool burden, right colon.
